# Patient Record
Sex: Female | Born: 1985 | Race: White | Hispanic: No | Marital: Single | Smoking: Current every day smoker
Health system: Southern US, Community
[De-identification: ages and names within clinical notes are randomized; demographics above are authoritative.]

## PROBLEM LIST (undated history)

## (undated) DIAGNOSIS — R87619 Unspecified abnormal cytological findings in specimens from cervix uteri: Secondary | ICD-10-CM

## (undated) DIAGNOSIS — IMO0002 Reserved for concepts with insufficient information to code with codable children: Secondary | ICD-10-CM

## (undated) DIAGNOSIS — F209 Schizophrenia, unspecified: Secondary | ICD-10-CM

## (undated) DIAGNOSIS — R51 Headache: Secondary | ICD-10-CM

## (undated) DIAGNOSIS — F419 Anxiety disorder, unspecified: Secondary | ICD-10-CM

## (undated) DIAGNOSIS — F32A Depression, unspecified: Secondary | ICD-10-CM

## (undated) DIAGNOSIS — A599 Trichomoniasis, unspecified: Secondary | ICD-10-CM

## (undated) DIAGNOSIS — G40909 Epilepsy, unspecified, not intractable, without status epilepticus: Secondary | ICD-10-CM

## (undated) DIAGNOSIS — F329 Major depressive disorder, single episode, unspecified: Secondary | ICD-10-CM

## (undated) DIAGNOSIS — F319 Bipolar disorder, unspecified: Secondary | ICD-10-CM

## (undated) HISTORY — PX: INCISION AND DRAINAGE OF WOUND: SHX1803

## (undated) HISTORY — PX: OTHER SURGICAL HISTORY: SHX169

---

## 2011-01-10 ENCOUNTER — Emergency Department (INDEPENDENT_AMBULATORY_CARE_PROVIDER_SITE_OTHER): Payer: Self-pay

## 2011-01-10 ENCOUNTER — Emergency Department (HOSPITAL_BASED_OUTPATIENT_CLINIC_OR_DEPARTMENT_OTHER)
Admission: EM | Admit: 2011-01-10 | Discharge: 2011-01-10 | Disposition: A | Discharge status: Home / Self Care | Payer: Self-pay | Attending: Emergency Medicine | Admitting: Emergency Medicine

## 2011-01-10 DIAGNOSIS — R0602 Shortness of breath: Secondary | ICD-10-CM

## 2011-01-10 DIAGNOSIS — S90569A Insect bite (nonvenomous), unspecified ankle, initial encounter: Secondary | ICD-10-CM | POA: Insufficient documentation

## 2011-01-10 DIAGNOSIS — J40 Bronchitis, not specified as acute or chronic: Secondary | ICD-10-CM | POA: Insufficient documentation

## 2011-01-10 DIAGNOSIS — F17200 Nicotine dependence, unspecified, uncomplicated: Secondary | ICD-10-CM | POA: Insufficient documentation

## 2011-03-05 ENCOUNTER — Inpatient Hospital Stay (HOSPITAL_COMMUNITY)
Admission: AD | Admit: 2011-03-05 | Discharge: 2011-03-05 | Disposition: A | Discharge status: Home / Self Care | Payer: Medicaid Other | Source: Ambulatory Visit | Attending: Obstetrics & Gynecology | Admitting: Obstetrics & Gynecology

## 2011-03-05 ENCOUNTER — Encounter (HOSPITAL_COMMUNITY): Payer: Self-pay | Admitting: *Deleted

## 2011-03-05 DIAGNOSIS — Z3480 Encounter for supervision of other normal pregnancy, unspecified trimester: Secondary | ICD-10-CM

## 2011-03-05 DIAGNOSIS — J40 Bronchitis, not specified as acute or chronic: Secondary | ICD-10-CM

## 2011-03-05 DIAGNOSIS — O99891 Other specified diseases and conditions complicating pregnancy: Secondary | ICD-10-CM | POA: Insufficient documentation

## 2011-03-05 DIAGNOSIS — J069 Acute upper respiratory infection, unspecified: Secondary | ICD-10-CM | POA: Insufficient documentation

## 2011-03-05 DIAGNOSIS — Z331 Pregnant state, incidental: Secondary | ICD-10-CM

## 2011-03-05 HISTORY — DX: Reserved for concepts with insufficient information to code with codable children: IMO0002

## 2011-03-05 HISTORY — DX: Bipolar disorder, unspecified: F31.9

## 2011-03-05 HISTORY — DX: Schizophrenia, unspecified: F20.9

## 2011-03-05 HISTORY — DX: Anxiety disorder, unspecified: F41.9

## 2011-03-05 HISTORY — DX: Depression, unspecified: F32.A

## 2011-03-05 HISTORY — DX: Unspecified abnormal cytological findings in specimens from cervix uteri: R87.619

## 2011-03-05 HISTORY — DX: Major depressive disorder, single episode, unspecified: F32.9

## 2011-03-05 MED ORDER — ALBUTEROL SULFATE HFA 108 (90 BASE) MCG/ACT IN AERS: 2.0000 | INHALATION_SPRAY | Freq: Once | RESPIRATORY_TRACT | Status: DC

## 2011-03-05 MED ORDER — ALBUTEROL SULFATE HFA 108 (90 BASE) MCG/ACT IN AERS
2.0000 | INHALATION_SPRAY | Freq: Once | RESPIRATORY_TRACT | Status: AC
  Administered 2011-03-05: 2 via RESPIRATORY_TRACT
  Filled 2011-03-05: qty 6.7

## 2011-03-05 NOTE — ED Provider Notes (Addendum)
Informal bedside ultrasound done and pt. Able to visualize cardiac activity.   Agree with note and plan by [name], NP  [facility]

## 2011-03-05 NOTE — ED Provider Notes (Addendum)
History     Chief Complaint  Patient presents with  . URI    The history is provided by the patient.  The patient states that she has had cough and congestion for the past couple weeks. Hx of asthma as a child but no problems for years. Coughing until vomits. Usually smokes a PPD but cut back since pregnant.  She is 9 wks. Gest. And the pregnancy was confirmed in [facility]. She denies bleeding or pain associated with the pregnancy.  She states she does not need a pelvic exam.  OB History    Grav Para Term Preterm Abortions TAB SAB Ect Mult Living   1    1  1          No past medical history on file.  No past surgical history on file.  No family history on file.  History  Substance Use Topics  . Smoking status: Not on file  . Smokeless tobacco: Not on file  . Alcohol Use: Not on file    Allergies: Allergies not on file  No prescriptions prior to admission    Review of Systems  Constitutional: Negative for fever, chills and weight loss.  HENT: Positive for congestion and sore throat. Negative for ear pain and neck pain.   Eyes: Negative for blurred vision and double vision.  Respiratory: Positive for cough and wheezing.   Cardiovascular: Negative.   Gastrointestinal: Positive for nausea. Negative for heartburn and abdominal pain.  Genitourinary: Negative for dysuria, urgency, frequency and flank pain.  Musculoskeletal: Negative for back pain.  Skin: Negative.   Neurological: Negative for dizziness and headaches.  Psychiatric/Behavioral:       Bipolar   Physical Exam   There were no vitals taken for this visit.  Physical Exam  Nursing note and vitals reviewed. Constitutional: She is oriented to person, place, and time. She appears well-developed and well-nourished.  HENT:  Head: Normocephalic and atraumatic.  Eyes: EOM are normal.  Neck: Neck supple.  Cardiovascular: Regular rhythm.   Respiratory: Effort normal. She has wheezes.  GI: Soft. There is no  tenderness.  Musculoskeletal: Normal range of motion.  Neurological: She is alert and oriented to person, place, and time. No cranial nerve deficit.  Skin: Skin is warm and dry.    MAU Course  Procedures Will treat patient with Z-Pak for bronchitis, cough medication and antimetics.  MDM  Agree with note and plan above  Reonna Finlayson A

## 2011-03-05 NOTE — Progress Notes (Signed)
Pt states she has had cold like symptoms for several days. Has not taken any medication not knowing what to take. Wants an ultrasound to make sure the baby is OK

## 2011-03-05 NOTE — Progress Notes (Signed)
Pt reports having a productive cough with green sputum and nasal congestion for 3 days. She states when she coughs her upper chest is tight. She also states she had pink spotting 2 days ago with mild cramping; she deeies spotting today but tender in her lower abd."  states she is tender in her low abd."

## 2011-04-19 ENCOUNTER — Encounter (HOSPITAL_COMMUNITY): Payer: Self-pay | Admitting: *Deleted

## 2011-04-19 ENCOUNTER — Inpatient Hospital Stay (HOSPITAL_COMMUNITY)
Admission: AD | Admit: 2011-04-19 | Discharge: 2011-04-19 | Disposition: A | Discharge status: Home / Self Care | Payer: Medicaid Other | Source: Ambulatory Visit | Attending: Obstetrics & Gynecology | Admitting: Obstetrics & Gynecology

## 2011-04-19 DIAGNOSIS — O99340 Other mental disorders complicating pregnancy, unspecified trimester: Secondary | ICD-10-CM | POA: Insufficient documentation

## 2011-04-19 DIAGNOSIS — J45909 Unspecified asthma, uncomplicated: Secondary | ICD-10-CM | POA: Insufficient documentation

## 2011-04-19 DIAGNOSIS — N949 Unspecified condition associated with female genital organs and menstrual cycle: Secondary | ICD-10-CM | POA: Insufficient documentation

## 2011-04-19 DIAGNOSIS — O9989 Other specified diseases and conditions complicating pregnancy, childbirth and the puerperium: Secondary | ICD-10-CM | POA: Insufficient documentation

## 2011-04-19 DIAGNOSIS — O99891 Other specified diseases and conditions complicating pregnancy: Secondary | ICD-10-CM | POA: Insufficient documentation

## 2011-04-19 DIAGNOSIS — G40909 Epilepsy, unspecified, not intractable, without status epilepticus: Secondary | ICD-10-CM | POA: Insufficient documentation

## 2011-04-19 DIAGNOSIS — F319 Bipolar disorder, unspecified: Secondary | ICD-10-CM | POA: Insufficient documentation

## 2011-04-19 HISTORY — DX: Epilepsy, unspecified, not intractable, without status epilepticus: G40.909

## 2011-04-19 LAB — URINALYSIS, ROUTINE W REFLEX MICROSCOPIC
Bilirubin Urine: NEGATIVE
Ketones, ur: NEGATIVE mg/dL
Leukocytes, UA: NEGATIVE
Nitrite: NEGATIVE
Protein, ur: NEGATIVE mg/dL

## 2011-04-19 LAB — CBC
MCV: 84.7 fL (ref 78.0–100.0)
Platelets: 248 10*3/uL (ref 150–400)
RBC: 4.51 MIL/uL (ref 3.87–5.11)
WBC: 14.9 10*3/uL — ABNORMAL HIGH (ref 4.0–10.5)

## 2011-04-19 LAB — WET PREP, GENITAL

## 2011-04-19 MED ORDER — PROMETHAZINE HCL 25 MG PO TABS
25.0000 mg | ORAL_TABLET | Freq: Once | ORAL | Status: AC
  Administered 2011-04-19: 25 mg via ORAL
  Filled 2011-04-19: qty 1

## 2011-04-19 MED ORDER — PROMETHAZINE HCL 25 MG PO TABS: 25.0000 mg | ORAL_TABLET | Freq: Once | ORAL | Status: AC

## 2011-04-19 NOTE — ED Provider Notes (Signed)
History     CSN: [id number] Arrival date & time: 04/19/2011  8:24 PM  Chief Complaint  Patient presents with  . Abdominal Pain   HPILynda Ray24 y.o.presents with lower abdominal pain X 12 hrs.  Comes and goes but when it comes it is sharp.  G2 P0010.  LMP 01/03/11.  Now [redacted]w[redacted]d.  Denies vaginal bleeding or discharge.   p Past Medical History  Diagnosis Date  . Anxiety   . Abnormal Pap smear   . Depression   . Bipolar disorder schizophrenia  . Bipolar 1 disorder     No meds   . Schizophrenia     no meds   . Asthma   . Epilepsy     Past Surgical History  Procedure Date  . Other surgical history     cyst removed from her" tailbone"  . No past surgeries   . Broken leg     right repair and ankle    Family History  Problem Relation Age of Onset  . Hypertension Mother   . Hyperlipidemia Mother   . Anxiety disorder Mother   . Hypertension Father   . Diabetes Father   . COPD Father   . Diabetes Sister     History  Substance Use Topics  . Smoking status: Current Everyday Smoker -- 0.2 packs/day    Types: Cigarettes  . Smokeless tobacco: Never Used  . Alcohol Use: No    OB History    Grav Para Term Preterm Abortions TAB SAB Ect Mult Living   2    1  1          Review of Systems  Gastrointestinal:       Abdominal pain.  Nausea because she hasn't eaten  Genitourinary: Negative.     Physical Exam  BP 123/83  Pulse 96  Temp(Src) 98.4 F (36.9 C) (Oral)  Resp 20  Ht 5\' 4"  (1.626 m)  Wt 278 lb (126.1 kg)  BMI 47.72 kg/m2  Physical Exam  Constitutional: She is oriented to person, place, and time. She appears well-developed and well-nourished.       obese  Neck: Normal range of motion.  Pulmonary/Chest: Effort normal.  Abdominal: Soft. There is tenderness (on right and left side consistent with round ligament pain.). There is no rebound and no guarding.  Genitourinary: Uterus is enlarged. Uterus is not tender. Cervix exhibits no motion tenderness, no discharge  and no friability. No erythema or bleeding around the vagina. Vaginal discharge: frothy white.       Without bleeding.   Neurological: She is alert and oriented to person, place, and time.  Skin: Skin is warm and dry.   Results for orders placed during the hospital encounter of 04/19/11 (from the past 24 hour(s))  WET PREP, GENITAL     Status: Abnormal   Collection Time   04/19/11  8:50 PM      Component Value Range   Yeast, Wet Prep NONE SEEN  NONE SEEN    Trich, Wet Prep NONE SEEN  NONE SEEN    Clue Cells, Wet Prep NONE SEEN  NONE SEEN    WBC, Wet Prep HPF POC TOO NUMEROUS TO COUNT (*) NONE SEEN   CBC     Status: Abnormal   Collection Time   04/19/11  9:37 PM      Component Value Range   WBC 14.9 (*) 4.0 - 10.5 (K/uL)   RBC 4.51  3.87 - 5.11 (MIL/uL)   Hemoglobin 12.7  12.0 -  15.0 (g/dL)   HCT 16.1  09.6 - 04.5 (%)   MCV 84.7  78.0 - 100.0 (fL)   MCH 28.2  26.0 - 34.0 (pg)   MCHC 33.2  30.0 - 36.0 (g/dL)   RDW 40.9  81.1 - 91.4 (%)   Platelets 248  150 - 400 (K/uL)   Results for orders placed during the hospital encounter of 04/19/11 (from the past 24 hour(s))  WET PREP, GENITAL     Status: Abnormal   Collection Time   04/19/11  8:50 PM      Component Value Range   Yeast, Wet Prep NONE SEEN  NONE SEEN    Trich, Wet Prep NONE SEEN  NONE SEEN    Clue Cells, Wet Prep NONE SEEN  NONE SEEN    WBC, Wet Prep HPF POC TOO NUMEROUS TO COUNT (*) NONE SEEN   CBC     Status: Abnormal   Collection Time   04/19/11  9:37 PM      Component Value Range   WBC 14.9 (*) 4.0 - 10.5 (K/uL)   RBC 4.51  3.87 - 5.11 (MIL/uL)   Hemoglobin 12.7  12.0 - 15.0 (g/dL)   HCT 78.2  95.6 - 21.3 (%)   MCV 84.7  78.0 - 100.0 (fL)   MCH 28.2  26.0 - 34.0 (pg)   MCHC 33.2  30.0 - 36.0 (g/dL)   RDW 08.6  57.8 - 46.9 (%)   Platelets 248  150 - 400 (K/uL)  URINALYSIS, ROUTINE W REFLEX MICROSCOPIC     Status: Abnormal   Collection Time   04/19/11 10:30 PM      Component Value Range   Color, Urine YELLOW   YELLOW    Appearance CLEAR  CLEAR    Specific Gravity, Urine <1.005 (*) 1.005 - 1.030    pH 6.0  5.0 - 8.0    Glucose, UA NEGATIVE  NEGATIVE (mg/dL)   Hgb urine dipstick NEGATIVE  NEGATIVE    Bilirubin Urine NEGATIVE  NEGATIVE    Ketones, ur NEGATIVE  NEGATIVE (mg/dL)   Protein, ur NEGATIVE  NEGATIVE (mg/dL)   Urobilinogen, UA 0.2  0.0 - 1.0 (mg/dL)   Nitrite NEGATIVE  NEGATIVE    Leukocytes, UA NEGATIVE  NEGATIVE    ED Course  Procedures GC/CHlamydia culture to lab Phenergan 25mg  po given in MAU.  She has a ride home.   Assessement: Round ligament pain at [redacted]w[redacted]d gestation.                           Nausea and vomiting in pregnancy Plan;  Keep plans to see Dr. Senaida Ores.  Call for appointment.  You may take tylenol for the round ligament pain Continue the phenergan you have at home for nausea.

## 2011-04-19 NOTE — Progress Notes (Signed)
Pain in lower part of stomach all day today, sharp pain. Denies dysuria, vaginal bleeding or N/V/D.

## 2011-04-19 NOTE — Progress Notes (Signed)
Patient is here with c/o intermittent sharp pain in her lower abdominal. She states that it started this morning. She states that she has nausea *(she didn't take any today) that she uses phenergan as needed. She denies any vaginal bleeding or discharge.

## 2011-04-20 LAB — GC/CHLAMYDIA PROBE AMP, GENITAL: Chlamydia, DNA Probe: NEGATIVE

## 2011-05-14 ENCOUNTER — Inpatient Hospital Stay (HOSPITAL_COMMUNITY)
Admission: AD | Admit: 2011-05-14 | Discharge: 2011-05-14 | Disposition: A | Discharge status: Home / Self Care | Payer: Medicaid Other | Source: Ambulatory Visit | Attending: Obstetrics and Gynecology | Admitting: Obstetrics and Gynecology

## 2011-05-14 ENCOUNTER — Inpatient Hospital Stay (HOSPITAL_COMMUNITY): Payer: Medicaid Other

## 2011-05-14 DIAGNOSIS — O99891 Other specified diseases and conditions complicating pregnancy: Secondary | ICD-10-CM | POA: Insufficient documentation

## 2011-05-14 DIAGNOSIS — S93409A Sprain of unspecified ligament of unspecified ankle, initial encounter: Secondary | ICD-10-CM

## 2011-05-14 DIAGNOSIS — S93401A Sprain of unspecified ligament of right ankle, initial encounter: Secondary | ICD-10-CM

## 2011-05-14 DIAGNOSIS — X58XXXA Exposure to other specified factors, initial encounter: Secondary | ICD-10-CM | POA: Insufficient documentation

## 2011-05-14 LAB — URINALYSIS, ROUTINE W REFLEX MICROSCOPIC
Bilirubin Urine: NEGATIVE
Nitrite: NEGATIVE
Specific Gravity, Urine: 1.01 (ref 1.005–1.030)
Urobilinogen, UA: 0.2 mg/dL (ref 0.0–1.0)
pH: 6 (ref 5.0–8.0)

## 2011-05-14 LAB — URINE MICROSCOPIC-ADD ON

## 2011-05-14 MED ORDER — ACETAMINOPHEN 500 MG PO TABS: 1000.0000 mg | ORAL_TABLET | Freq: Three times a day (TID) | ORAL | Status: DC | PRN

## 2011-05-14 NOTE — Progress Notes (Signed)
Pt states that  Yesterday she twisted her ankle-since then her rt foot and leg are very swollen-she doesn't know if it is related

## 2011-05-14 NOTE — Progress Notes (Signed)
Pt states, " I noticed that my right leg started to swell at 11 am, and it has got worse as the day went on. My leg hurts so bad that I can't put weight on it."

## 2011-05-14 NOTE — ED Provider Notes (Signed)
History     Chief Complaint  Patient presents with  . Leg Swelling   HPI This patient is a 25 year old G2 P0 010 at 18 weeks and 2 days who is seen today in the MAU for her right ankle pain.   the pain started yesterday evening after her suffering and inversion injury at a friend's house. The patient has been in ambulatory on the foot last evening as well as throughout the day today however with great difficulty.  The pain is on the medial aspect of the right foot. The pain is approximately 7/10. There is no radiation of the pain. The patient has taken Tylenol with mild improvement. The patient has a history of a right ankle fracture with repair.  OB History    Grav Para Term Preterm Abortions TAB SAB Ect Mult Living   2    1  1          Past Medical History  Diagnosis Date  . Anxiety   . Abnormal Pap smear   . Depression   . Bipolar disorder schizophrenia  . Bipolar 1 disorder     No meds   . Schizophrenia     no meds   . Asthma   . Epilepsy     Past Surgical History  Procedure Date  . Other surgical history     cyst removed from her" tailbone"  . No past surgeries   . Broken leg     right repair and ankle    Family History  Problem Relation Age of Onset  . Hypertension Mother   . Hyperlipidemia Mother   . Anxiety disorder Mother   . Hypertension Father   . Diabetes Father   . COPD Father   . Diabetes Sister     History  Substance Use Topics  . Smoking status: Current Everyday Smoker -- 0.2 packs/day    Types: Cigarettes  . Smokeless tobacco: Never Used  . Alcohol Use: No    Allergies:  Allergies  Allergen Reactions  . Betadine (Povidone Iodine) Rash  . Contrast Media (Iodinated Diagnostic Agents) Rash  . Darvocet (Propoxyphene N-Acetaminophen) Rash  . Iodine Rash  . Shellfish Allergy Swelling and Rash    Prescriptions prior to admission  Medication Sig Dispense Refill  . acetaminophen (TYLENOL) 325 MG tablet Take 650 mg by mouth daily as needed.         Marland Kitchen albuterol (PROVENTIL HFA;VENTOLIN HFA) 108 (90 BASE) MCG/ACT inhaler Inhale 2 puffs into the lungs once.  1 Inhaler  0  . ALBUTEROL SULFATE HFA IN Inhale 2 puffs into the lungs daily as needed.        . prenatal vitamin w/FE, FA (PRENATAL 1 + 1) 27-1 MG TABS Take 1 tablet by mouth daily.          Review of Systems  All other systems reviewed and are negative.   Physical Exam   Blood pressure 122/75, pulse 91, temperature 99.1 F (37.3 C), temperature source Oral, resp. rate 20, height 5\' 4"  (1.626 m), weight 129.445 kg (285 lb 6 oz).  Physical Exam  Constitutional: She appears well-developed and well-nourished.  HENT:  Head: Normocephalic and atraumatic.  Musculoskeletal: She exhibits edema.       Tenderness to the distal right tibia. There is 2+ edema to the knees bilaterally. There is mild bruising around the medial malleolus. He eversion and inversion are limited in range of motion. There is mildly decreased dorsiflexion and plantar flexion.   Right ankle  x-Stephens - no evidence of acute distal tibia or mid tibia or fibula shaft fracture. MAU Course  Procedures  Assessment and Plan  #1 right ankle sprains - instructed the patient on protection rest ice compression and elevation. Will have patient take Tylenol 1000 mg every 8 hours as needed for pain. Instructed the patient on icing the right ankle for 15 minutes to 4 times a day.  Dannae Kato JEHIEL 05/14/2011, 10:28 PM

## 2011-05-18 LAB — RPR: RPR: NONREACTIVE

## 2011-05-18 LAB — ABO/RH: RH Type: POSITIVE

## 2011-05-18 LAB — ANTIBODY SCREEN: Antibody Screen: NEGATIVE

## 2011-05-18 LAB — HIV ANTIBODY (ROUTINE TESTING W REFLEX): HIV: NONREACTIVE

## 2011-05-22 ENCOUNTER — Other Ambulatory Visit (HOSPITAL_COMMUNITY): Payer: Self-pay | Admitting: Obstetrics and Gynecology

## 2011-05-22 DIAGNOSIS — Z3689 Encounter for other specified antenatal screening: Secondary | ICD-10-CM

## 2011-05-22 DIAGNOSIS — O2690 Pregnancy related conditions, unspecified, unspecified trimester: Secondary | ICD-10-CM

## 2011-06-07 ENCOUNTER — Encounter (HOSPITAL_COMMUNITY): Payer: Self-pay

## 2011-06-07 ENCOUNTER — Ambulatory Visit (HOSPITAL_COMMUNITY)
Admission: RE | Admit: 2011-06-07 | Discharge: 2011-06-07 | Disposition: A | Discharge status: Home / Self Care | Payer: Medicaid Other | Source: Ambulatory Visit | Attending: Obstetrics and Gynecology | Admitting: Obstetrics and Gynecology

## 2011-06-07 DIAGNOSIS — Z3689 Encounter for other specified antenatal screening: Secondary | ICD-10-CM

## 2011-06-07 DIAGNOSIS — O358XX0 Maternal care for other (suspected) fetal abnormality and damage, not applicable or unspecified: Secondary | ICD-10-CM | POA: Insufficient documentation

## 2011-06-07 DIAGNOSIS — Z1389 Encounter for screening for other disorder: Secondary | ICD-10-CM | POA: Insufficient documentation

## 2011-06-07 DIAGNOSIS — E669 Obesity, unspecified: Secondary | ICD-10-CM | POA: Insufficient documentation

## 2011-06-07 DIAGNOSIS — O2690 Pregnancy related conditions, unspecified, unspecified trimester: Secondary | ICD-10-CM

## 2011-06-07 DIAGNOSIS — Z363 Encounter for antenatal screening for malformations: Secondary | ICD-10-CM | POA: Insufficient documentation

## 2011-06-14 ENCOUNTER — Encounter (HOSPITAL_COMMUNITY): Payer: Self-pay

## 2011-06-14 ENCOUNTER — Inpatient Hospital Stay (HOSPITAL_COMMUNITY)
Admission: AD | Admit: 2011-06-14 | Discharge: 2011-06-14 | Disposition: A | Discharge status: Home / Self Care | Payer: Medicaid Other | Source: Ambulatory Visit | Attending: Obstetrics and Gynecology | Admitting: Obstetrics and Gynecology

## 2011-06-14 DIAGNOSIS — J069 Acute upper respiratory infection, unspecified: Secondary | ICD-10-CM | POA: Insufficient documentation

## 2011-06-14 DIAGNOSIS — O99891 Other specified diseases and conditions complicating pregnancy: Secondary | ICD-10-CM | POA: Insufficient documentation

## 2011-06-14 LAB — URINALYSIS, ROUTINE W REFLEX MICROSCOPIC
Bilirubin Urine: NEGATIVE
Ketones, ur: NEGATIVE mg/dL
Nitrite: NEGATIVE
Protein, ur: NEGATIVE mg/dL
Specific Gravity, Urine: 1.01 (ref 1.005–1.030)
Urobilinogen, UA: 0.2 mg/dL (ref 0.0–1.0)

## 2011-06-14 MED ORDER — HYDROCOD POLST-CHLORPHEN POLST 10-8 MG/5ML PO LQCR: 5.0000 mL | Freq: Once | ORAL | Status: DC

## 2011-06-14 MED ORDER — HYDROCOD POLST-CHLORPHEN POLST 10-8 MG/5ML PO LQCR
5.0000 mL | Freq: Once | ORAL | Status: AC
  Administered 2011-06-14: 5 mL via ORAL
  Filled 2011-06-14: qty 5

## 2011-06-14 NOTE — Progress Notes (Signed)
Patient states she started having an itchy throat 2 days ago, started having increasing cold symptoms cough, congestion, runny nose and throat itchy, not painful. Mid to upper back pain from coughing. No fever.

## 2011-06-14 NOTE — Progress Notes (Signed)
Pt states tylenol for pain, Rexall OTC cough syrup, no relief. Has hx asthma as child, however states grew out of it. Has abd pain with coughing. Muscles/ribs feel sore. Denies bleeding or vaginal d/c changes.

## 2011-06-14 NOTE — ED Provider Notes (Signed)
History     Chief Complaint  Patient presents with  . URI   HPI G2 P0010 at [redacted]w[redacted]d presents with c/o cough for 2 days. States has gotten sore in her back and lower abdomen as a result. States has remote history of asthma as a baby, but did use an inhaler this morning. No fever. No other symptoms.   Past Medical History  Diagnosis Date  . Anxiety   . Abnormal Pap smear   . Depression   . Bipolar disorder schizophrenia  . Bipolar 1 disorder     No meds   . Schizophrenia     no meds   . Asthma   . Epilepsy     Past Surgical History  Procedure Date  . Other surgical history     cyst removed from her" tailbone"  . Broken leg     right repair and ankle  . Incision and drainage of wound     pyelonidal cyst    Family History  Problem Relation Age of Onset  . Hypertension Mother   . Hyperlipidemia Mother   . Anxiety disorder Mother   . Hypertension Father   . Diabetes Father   . COPD Father   . Diabetes Sister     History  Substance Use Topics  . Smoking status: Current Everyday Smoker -- 0.2 packs/day    Types: Cigarettes  . Smokeless tobacco: Never Used  . Alcohol Use: No    Allergies:  Allergies  Allergen Reactions  . Betadine (Povidone Iodine) Rash  . Contrast Media (Iodinated Diagnostic Agents) Rash  . Darvocet (Propoxyphene N-Acetaminophen) Rash  . Iodine Rash  . Shellfish Allergy Swelling and Rash    Prescriptions prior to admission  Medication Sig Dispense Refill  . acetaminophen (TYLENOL) 500 MG tablet Take 2 tablets (1,000 mg total) by mouth every 8 (eight) hours as needed for pain. Headache/pain  30 tablet    . albuterol (PROVENTIL HFA;VENTOLIN HFA) 108 (90 BASE) MCG/ACT inhaler Inhale 2 puffs into the lungs as needed. Shortness of breath      . prenatal vitamin w/FE, FA (PRENATAL 1 + 1) 27-1 MG TABS Take 1 tablet by mouth daily.        . ALBUTEROL SULFATE HFA IN Inhale 2 puffs into the lungs daily as needed.          Review of Systems    Constitutional: Positive for chills. Negative for fever.  HENT: Positive for congestion. Negative for sore throat.   Respiratory: Positive for cough and wheezing. Negative for sputum production.    Physical Exam   Blood pressure 135/74, pulse 110, temperature 97.4 F (36.3 C), temperature source Oral, resp. rate 24, height 5' 5.5" (1.664 m), weight 281 lb (127.461 kg), last menstrual period 01/03/2011, SpO2 96.00%.  Physical Exam  Constitutional: She is oriented to person, place, and time. She appears well-developed and well-nourished. No distress.  HENT:  Head: Normocephalic.  Neck: Normal range of motion.  Cardiovascular:       Heart rate 120s s/p inhaler  Respiratory: Effort normal. No respiratory distress. She has wheezes (Insp wheeze left upper, exp. wheeze right upper). She has no rales.  Neurological: She is alert and oriented to person, place, and time.  Skin: Skin is warm and dry.  Psychiatric: She has a normal mood and affect.    MAU Course  Procedures  MDM  Assessment and Plan  A:  URI      IUP at 23.1 weeks  P:  Consulted Dr Jackelyn Knife      Will give Tussionex as adjuvant antitussive      Smoking cessation counseling      Follow up with office if worsens      Use inhaler q 6 hrs prn  [facility] 06/14/2011, 10:36 AM

## 2011-06-24 ENCOUNTER — Inpatient Hospital Stay (HOSPITAL_COMMUNITY)
Admission: AD | Admit: 2011-06-24 | Discharge: 2011-06-24 | Disposition: A | Discharge status: Home / Self Care | Payer: Medicaid Other | Source: Ambulatory Visit | Attending: Obstetrics and Gynecology | Admitting: Obstetrics and Gynecology

## 2011-06-24 ENCOUNTER — Encounter (HOSPITAL_COMMUNITY): Payer: Self-pay | Admitting: Advanced Practice Midwife

## 2011-06-24 ENCOUNTER — Encounter (HOSPITAL_COMMUNITY): Payer: Self-pay | Admitting: Obstetrics and Gynecology

## 2011-06-24 DIAGNOSIS — O2692 Pregnancy related conditions, unspecified, second trimester: Secondary | ICD-10-CM

## 2011-06-24 DIAGNOSIS — O99891 Other specified diseases and conditions complicating pregnancy: Secondary | ICD-10-CM | POA: Insufficient documentation

## 2011-06-24 DIAGNOSIS — O2690 Pregnancy related conditions, unspecified, unspecified trimester: Secondary | ICD-10-CM

## 2011-06-24 LAB — POCT FERN TEST: Fern Test: NEGATIVE

## 2011-06-24 LAB — URINALYSIS, ROUTINE W REFLEX MICROSCOPIC
Glucose, UA: NEGATIVE mg/dL
Hgb urine dipstick: NEGATIVE
Leukocytes, UA: NEGATIVE
Protein, ur: NEGATIVE mg/dL
Specific Gravity, Urine: >1.03 — ABNORMAL HIGH (ref 1.005–1.030)
pH: 5.5 (ref 5.0–8.0)

## 2011-06-24 NOTE — ED Provider Notes (Signed)
History     CSN: [id number] Arrival date & time: 06/24/2011  2:55 PM   None     Chief Complaint  Patient presents with  . Vaginal Discharge    HPI Casey Price is a 25 y.o. female @ [redacted]w[redacted]d who presents to MAU for ? Rupture of membranes. She states that last night she coughed and felt a "pop" in her abdomen and then felt fluid leaking and then twice today. Denies bleeding or any other problems. She is very concerned because another family member had a preterm delivery and the baby is in NICU.   Past Medical History  Diagnosis Date  . Anxiety   . Abnormal Pap smear   . Depression   . Bipolar disorder schizophrenia  . Bipolar 1 disorder     No meds   . Schizophrenia     no meds   . Asthma   . Epilepsy     Past Surgical History  Procedure Date  . Other surgical history     cyst removed from her" tailbone"  . Broken leg     right repair and ankle  . Incision and drainage of wound     pyelonidal cyst    Family History  Problem Relation Age of Onset  . Hypertension Mother   . Hyperlipidemia Mother   . Anxiety disorder Mother   . Hypertension Father   . Diabetes Father   . COPD Father   . Diabetes Sister     History  Substance Use Topics  . Smoking status: Current Everyday Smoker -- 0.2 packs/day    Types: Cigarettes  . Smokeless tobacco: Never Used  . Alcohol Use: No    OB History    Grav Para Term Preterm Abortions TAB SAB Ect Mult Living   2    1  1          Review of Systems  Allergies  Betadine; Contrast media; Darvocet; Iodine; and Shellfish allergy  Home Medications  No current outpatient prescriptions on file.  BP 129/65  Pulse 116  Temp(Src) 98.7 F (37.1 C) (Oral)  Ht 5\' 4"  (1.626 m)  Wt 277 lb 3.2 oz (125.737 kg)  BMI 47.58 kg/m2  LMP 01/03/2011  Physical Exam  Nursing note and vitals reviewed. Constitutional: She is oriented to person, place, and time. No distress.       Obese  HENT:  Head: Normocephalic.       hirsutism    Eyes: EOM are normal.  Neck: Neck supple.  Cardiovascular:       tachycardia  Pulmonary/Chest: Effort normal.  Abdominal: Soft.       Gravid at 24.1 wks. gestation  Genitourinary:       External genitalia without lesions. Mucous discharge vaginal vault, no pooling noted. Cervix closed and thick.   Musculoskeletal: Normal range of motion.  Neurological: She is alert and oriented to person, place, and time. No cranial nerve deficit.  Skin: Skin is warm and dry.  Psychiatric: Thought content normal.       bipolar   EFM tracing shows no contractions, base line fetal heart rate 150.  ED Course: consult with Dr. Jackelyn Knife. Will d/c patient home to follow up in the office.  Procedures   Assessment: Second trimester pregnancy   No rupture of membranes  Plan:  Discharge home to follow up in the office   Return here as needed.        [place], [place] 06/24/11 [phone]

## 2011-06-24 NOTE — Progress Notes (Signed)
Pt presents to MAU with chief complaint of possible ROM. Pt is 24 weeks 4 days and noticed fluid leaking last night around 9PM. She noticed the trickling of fluid throughout the day.

## 2011-06-24 NOTE — Progress Notes (Signed)
Yesterday coughed and felt something pop in her stomach felt a little gush, today felt gushes x 2.

## 2011-07-23 ENCOUNTER — Encounter (HOSPITAL_COMMUNITY): Payer: Self-pay | Admitting: *Deleted

## 2011-07-23 ENCOUNTER — Inpatient Hospital Stay (HOSPITAL_COMMUNITY)
Admission: AD | Admit: 2011-07-23 | Discharge: 2011-07-23 | Disposition: A | Discharge status: Home / Self Care | Payer: Medicaid Other | Source: Ambulatory Visit | Attending: Obstetrics and Gynecology | Admitting: Obstetrics and Gynecology

## 2011-07-23 DIAGNOSIS — N949 Unspecified condition associated with female genital organs and menstrual cycle: Secondary | ICD-10-CM

## 2011-07-23 DIAGNOSIS — R109 Unspecified abdominal pain: Secondary | ICD-10-CM | POA: Insufficient documentation

## 2011-07-23 DIAGNOSIS — O99891 Other specified diseases and conditions complicating pregnancy: Secondary | ICD-10-CM | POA: Insufficient documentation

## 2011-07-23 LAB — WET PREP, GENITAL: Clue Cells Wet Prep HPF POC: NONE SEEN

## 2011-07-23 LAB — URINALYSIS, ROUTINE W REFLEX MICROSCOPIC
Glucose, UA: NEGATIVE mg/dL
Ketones, ur: NEGATIVE mg/dL
Nitrite: NEGATIVE
Specific Gravity, Urine: >1.03 — ABNORMAL HIGH (ref 1.005–1.030)
pH: 5.5 (ref 5.0–8.0)

## 2011-07-23 LAB — URINE MICROSCOPIC-ADD ON

## 2011-07-23 MED ORDER — COMFORT FIT MATERNITY SUPP LG MISC: 1.0000 [IU] | Status: DC | PRN

## 2011-07-23 NOTE — ED Provider Notes (Signed)
History     Chief Complaint  Patient presents with  . Abdominal Pain   HPI Sharp low abd pain today starting around 9 AM, has happened about 5 or 6 times in the last 5 hours, radiates to back, lasts about 3-10 seconds, worse with moving around. + fetal movement, no bleeding or LOF.     Past Medical History  Diagnosis Date  . Anxiety   . Abnormal Pap smear   . Depression   . Bipolar disorder schizophrenia  . Bipolar 1 disorder     No meds   . Schizophrenia     no meds   . Asthma   . Epilepsy     Past Surgical History  Procedure Date  . Other surgical history     cyst removed from her" tailbone"  . Broken leg     right repair and ankle  . Incision and drainage of wound     pyelonidal cyst    Family History  Problem Relation Age of Onset  . Hypertension Mother   . Hyperlipidemia Mother   . Anxiety disorder Mother   . Hypertension Father   . Diabetes Father   . COPD Father   . Diabetes Sister     History  Substance Use Topics  . Smoking status: Current Everyday Smoker -- 0.2 packs/day    Types: Cigarettes  . Smokeless tobacco: Never Used  . Alcohol Use: No    Allergies:  Allergies  Allergen Reactions  . Betadine (Povidone Iodine) Rash  . Contrast Media (Iodinated Diagnostic Agents) Rash  . Darvocet (Propoxyphene N-Acetaminophen) Rash  . Iodine Rash  . Shellfish Allergy Swelling and Rash    No prescriptions prior to admission    Review of Systems  Constitutional: Negative.   Respiratory: Negative.   Cardiovascular: Negative.   Gastrointestinal: Positive for abdominal pain. Negative for nausea, vomiting, diarrhea and constipation.  Genitourinary: Negative for dysuria, urgency, frequency, hematuria and flank pain.       Negative for vaginal bleeding, cramping/contractions  Musculoskeletal: Negative.   Neurological: Negative.   Psychiatric/Behavioral: Negative.    Physical Exam   Blood pressure 139/71, pulse 110, temperature 97.9 F (36.6 C),  temperature source Oral, resp. rate 18, height 5\' 5"  (1.651 m), weight 129.275 kg (285 lb), last menstrual period 01/03/2011.  Physical Exam  Nursing note and vitals reviewed. Constitutional: She is oriented to person, place, and time. She appears well-developed and well-nourished. No distress.  Cardiovascular: Normal rate.   Respiratory: Effort normal.  GI: Soft. There is no tenderness.  Genitourinary: No bleeding around the vagina. Vaginal discharge (thick, white) found.       SVE: closed/thick/high  Musculoskeletal: Normal range of motion.  Neurological: She is alert and oriented to person, place, and time.  Skin: Skin is warm and dry.  Psychiatric: She has a normal mood and affect.   EFM reactive, TOCO quiet MAU Course  Procedures  Results for orders placed during the hospital encounter of 07/23/11 (from the past 24 hour(s))  URINALYSIS, ROUTINE W REFLEX MICROSCOPIC     Status: Abnormal   Collection Time   07/23/11  2:00 PM      Component Value Range   Color, Urine YELLOW  YELLOW    APPearance HAZY (*) CLEAR    Specific Gravity, Urine >1.030 (*) 1.005 - 1.030    pH 5.5  5.0 - 8.0    Glucose, UA NEGATIVE  NEGATIVE (mg/dL)   Hgb urine dipstick NEGATIVE  NEGATIVE  Bilirubin Urine NEGATIVE  NEGATIVE    Ketones, ur NEGATIVE  NEGATIVE (mg/dL)   Protein, ur NEGATIVE  NEGATIVE (mg/dL)   Urobilinogen, UA 0.2  0.0 - 1.0 (mg/dL)   Nitrite NEGATIVE  NEGATIVE    Leukocytes, UA TRACE (*) NEGATIVE   URINE MICROSCOPIC-ADD ON     Status: Abnormal   Collection Time   07/23/11  2:00 PM      Component Value Range   Squamous Epithelial / LPF MANY (*) RARE    WBC, UA 0-2  <3 (WBC/hpf)   Bacteria, UA FEW (*) RARE    Crystals CA OXALATE CRYSTALS (*) NEGATIVE   WET PREP, GENITAL     Status: Abnormal   Collection Time   07/23/11  2:25 PM      Component Value Range   Yeast, Wet Prep NONE SEEN  NONE SEEN    Trich, Wet Prep NONE SEEN  NONE SEEN    Clue Cells, Wet Prep NONE SEEN  NONE SEEN     WBC, Wet Prep HPF POC FEW (*) NONE SEEN      Assessment and Plan  25 y.o. G2P0010 at [redacted]w[redacted]d Round ligament pain, no signs of labor F/U as scheduled, precautions rev'd  FRAZIER,NATALIE 07/23/2011, 7:30 PM

## 2011-07-23 NOTE — Progress Notes (Signed)
Pt in for lower abdominal sharp pains that radiate towards back.  States pains started this morning and are worse with movement.  Denies any urinary symptoms.  + FM.  Denies any bleeding or leaking of fluid.

## 2011-07-24 LAB — GC/CHLAMYDIA PROBE AMP, GENITAL: GC Probe Amp, Genital: NEGATIVE

## 2011-08-21 NOTE — L&D Delivery Note (Signed)
Delivery Note At 6:30 PM a healthy female was delivered via Vaginal, Spontaneous Delivery (Presentation:LOA).  APGAR: 8,9  weight 6 lb 8.2 oz (2954 g).   Placenta status delivered spontaneously.   Cord:  with the following complications: none.   Anesthesia: Epidural  Episiotomy: n/a  Lacerations: First degree and right labial Suture Repair: 3.0 vicryl rapide Est. Blood Loss (mL): 300cc Mom to postpartum.  Baby to nursery-stable.  Oliver Pila 09/20/2011, 6:43 PM

## 2011-09-03 ENCOUNTER — Inpatient Hospital Stay (HOSPITAL_COMMUNITY)
Admission: AD | Admit: 2011-09-03 | Discharge: 2011-09-03 | Disposition: A | Discharge status: Home / Self Care | Payer: Medicaid Other | Source: Ambulatory Visit | Attending: Obstetrics and Gynecology | Admitting: Obstetrics and Gynecology

## 2011-09-03 ENCOUNTER — Encounter (HOSPITAL_COMMUNITY): Payer: Self-pay | Admitting: *Deleted

## 2011-09-03 DIAGNOSIS — O4700 False labor before 37 completed weeks of gestation, unspecified trimester: Secondary | ICD-10-CM | POA: Insufficient documentation

## 2011-09-03 DIAGNOSIS — R030 Elevated blood-pressure reading, without diagnosis of hypertension: Secondary | ICD-10-CM

## 2011-09-03 DIAGNOSIS — R109 Unspecified abdominal pain: Secondary | ICD-10-CM | POA: Insufficient documentation

## 2011-09-03 HISTORY — DX: Headache: R51

## 2011-09-03 HISTORY — DX: Trichomoniasis, unspecified: A59.9

## 2011-09-03 LAB — URINALYSIS, ROUTINE W REFLEX MICROSCOPIC
Glucose, UA: NEGATIVE mg/dL
Hgb urine dipstick: NEGATIVE
Ketones, ur: NEGATIVE mg/dL
Leukocytes, UA: NEGATIVE
pH: 6.5 (ref 5.0–8.0)

## 2011-09-03 MED ORDER — TERBUTALINE SULFATE 1 MG/ML IJ SOLN
0.2500 mg | Freq: Once | INTRAMUSCULAR | Status: AC
  Administered 2011-09-03: 11:00:00 via SUBCUTANEOUS
  Filled 2011-09-03: qty 1

## 2011-09-03 NOTE — ED Notes (Signed)
C/o burning and pressure with urination

## 2011-09-03 NOTE — ED Notes (Signed)
Pt states has not felt any "balling up" since been placed on monitor.

## 2011-09-03 NOTE — ED Notes (Addendum)
Dr Ambrose Mantle notified of cervical change, prior ext cervix loose 1, internal os- ft 90. Now ext 4/80.

## 2011-09-03 NOTE — ED Notes (Signed)
Bedside US confirmed vtx, will notify MD

## 2011-09-03 NOTE — ED Provider Notes (Signed)
History     CSN: [id number]  Arrival date & time 09/03/11  0855   None     Chief Complaint  Patient presents with  . Labor Eval    HPI Casey Price is a 26 y.o. female @ [redacted]w[redacted]d gestation who presents to MAU for abdominal cramping. She describes the pain as balling up, feeling tight. Onset of back pain last pm. She had one episode of the cramping prior to being placed on the monitor but none since then. She denies bleeding or vaginal discharge. Patient states that she is feeling the baby move a lot. The history was provided by the patient.  Past Medical History  Diagnosis Date  . Anxiety   . Abnormal Pap smear   . Depression   . Bipolar disorder schizophrenia  . Bipolar 1 disorder     No meds   . Schizophrenia     no meds   . Asthma   . Epilepsy     "grew out of it, no problem since age 35"  . Headache   . Trichomonas     Past Surgical History  Procedure Date  . Other surgical history     cyst removed from her" tailbone"  . Broken leg     right repair and ankle- no surgical repair  . Incision and drainage of wound     pyelonidal cyst    Family History  Problem Relation Age of Onset  . Hypertension Mother   . Hyperlipidemia Mother   . Anxiety disorder Mother   . Hypertension Father   . Diabetes Father   . COPD Father   . Diabetes Sister   . Anesthesia problems Other     History  Substance Use Topics  . Smoking status: Current Everyday Smoker -- 0.2 packs/day for 13 years    Types: Cigarettes  . Smokeless tobacco: Never Used  . Alcohol Use: No     prior to preg    OB History    Grav Para Term Preterm Abortions TAB SAB Ect Mult Living   2    1  1          Review of Systems  Constitutional: Negative for fever, chills, diaphoresis and fatigue.  HENT: Negative for ear pain, congestion, sore throat, facial swelling, neck pain, neck stiffness, dental problem and sinus pressure.   Eyes: Negative for photophobia, pain and discharge.  Respiratory:  Negative for cough, chest tightness and wheezing.   Cardiovascular: Negative.   Gastrointestinal: Positive for nausea, abdominal pain and constipation. Negative for vomiting, diarrhea and abdominal distention.  Genitourinary: Positive for urgency, frequency and vaginal discharge. Negative for dysuria, flank pain, vaginal bleeding and difficulty urinating.  Musculoskeletal: Positive for back pain. Negative for myalgias and gait problem.  Skin: Negative for color change and rash.  Neurological: Negative for dizziness, speech difficulty, weakness, light-headedness, numbness and headaches.  Psychiatric/Behavioral: Negative for confusion and agitation. Nervous/anxious: bipolar.     Allergies  Betadine; Contrast media; Darvocet; Iodine; and Shellfish allergy  Home Medications  No current outpatient prescriptions on file.  BP 120/72  Pulse 106  Temp(Src) 97.9 F (36.6 C) (Oral)  Resp 22  LMP 01/03/2011  Physical Exam  Nursing note and vitals reviewed. Constitutional: She is oriented to person, place, and time. She appears well-developed and well-nourished.  HENT:  Head: Normocephalic.  Eyes: EOM are normal.  Neck: Neck supple.  Cardiovascular:       Tachycardia   Pulmonary/Chest: Effort normal.  Abdominal: Soft.  There is minimal tenderness on palpation of left upper abdomen.   Musculoskeletal: Normal range of motion.  Neurological: She is alert and oriented to person, place, and time. No cranial nerve deficit.  Skin: Skin is warm and dry.  Psychiatric: Her behavior is normal. Judgment and thought content normal.   Results for orders placed during the hospital encounter of 09/03/11 (from the past 24 hour(s))  URINALYSIS, ROUTINE W REFLEX MICROSCOPIC     Status: Abnormal   Collection Time   09/03/11  9:48 AM      Component Value Range   Color, Urine YELLOW  YELLOW    APPearance CLEAR  CLEAR    Specific Gravity, Urine <1.005 (*) 1.005 - 1.030    pH 6.5  5.0 - 8.0     Glucose, UA NEGATIVE  NEGATIVE (mg/dL)   Hgb urine dipstick NEGATIVE  NEGATIVE    Bilirubin Urine NEGATIVE  NEGATIVE    Ketones, ur NEGATIVE  NEGATIVE (mg/dL)   Protein, ur NEGATIVE  NEGATIVE (mg/dL)   Urobilinogen, UA 0.2  0.0 - 1.0 (mg/dL)   Nitrite NEGATIVE  NEGATIVE    Leukocytes, UA NEGATIVE  NEGATIVE    Monitor tracing is reactive with rare contraction.  ED Course: discussed with Dr. Ambrose Mantle, will continue to monitor and  RN to check cervix and report back to Dr. Ambrose Mantle.  Procedures RN checked patient's cervix and was fingertip and thinning. She spoke with Dr. Ambrose Mantle and he ordered Terb. And continued monitoring.  Re evaluation: Contractions increased and recheck of cervix patient is dilated to 4 cm. Dr. Ambrose Mantle notified and he will admit patient.  MDM          [name], NP 09/03/11 1220

## 2011-09-03 NOTE — ED Notes (Signed)
Pt crying out occ " ow"  abd remains soft to palpation.  Pt breathing as if in labor, no palpable changes noted in uterine tone.

## 2011-09-03 NOTE — ED Notes (Signed)
Called into rm, "pain at cervix"  Pt up to br, then checked.

## 2011-09-03 NOTE — Progress Notes (Signed)
Back Pain started last night. abd pain started this morning- balling up, stops her in her tracks.

## 2011-09-12 ENCOUNTER — Other Ambulatory Visit: Payer: Self-pay | Admitting: Obstetrics and Gynecology

## 2011-09-12 ENCOUNTER — Inpatient Hospital Stay (HOSPITAL_COMMUNITY)
Admission: AD | Admit: 2011-09-12 | Discharge: 2011-09-12 | Disposition: A | Discharge status: Home / Self Care | Payer: Medicaid Other | Source: Ambulatory Visit | Attending: Obstetrics and Gynecology | Admitting: Obstetrics and Gynecology

## 2011-09-12 ENCOUNTER — Encounter (HOSPITAL_COMMUNITY): Payer: Self-pay

## 2011-09-12 DIAGNOSIS — O99891 Other specified diseases and conditions complicating pregnancy: Secondary | ICD-10-CM | POA: Insufficient documentation

## 2011-09-12 DIAGNOSIS — R030 Elevated blood-pressure reading, without diagnosis of hypertension: Secondary | ICD-10-CM | POA: Insufficient documentation

## 2011-09-12 LAB — COMPREHENSIVE METABOLIC PANEL
Alkaline Phosphatase: 163 U/L — ABNORMAL HIGH (ref 39–117)
BUN: 5 mg/dL — ABNORMAL LOW (ref 6–23)
Chloride: 105 mEq/L (ref 96–112)
Creatinine, Ser: 0.54 mg/dL (ref 0.50–1.10)
GFR calc Af Amer: >90 mL/min (ref >90)
Glucose, Bld: 84 mg/dL (ref 70–99)
Potassium: 3.7 mEq/L (ref 3.5–5.1)
Total Bilirubin: 0.2 mg/dL — ABNORMAL LOW (ref 0.3–1.2)

## 2011-09-12 LAB — CBC
HCT: 39.6 % (ref 36.0–46.0)
Hemoglobin: 13 g/dL (ref 12.0–15.0)
MCHC: 32.8 g/dL (ref 30.0–36.0)
MCV: 85 fL (ref 78.0–100.0)
WBC: 14.9 10*3/uL — ABNORMAL HIGH (ref 4.0–10.5)

## 2011-09-12 LAB — URINALYSIS, ROUTINE W REFLEX MICROSCOPIC
Ketones, ur: NEGATIVE mg/dL
Leukocytes, UA: NEGATIVE
Nitrite: NEGATIVE
Protein, ur: NEGATIVE mg/dL
Urobilinogen, UA: 0.2 mg/dL (ref 0.0–1.0)

## 2011-09-12 NOTE — Progress Notes (Signed)
Patient sent from the office for evaluation of elevated blood pressure and proteinuria. Patient states she had a headache yesterday but not today. Denies any bleeding or pain and reports good fetal movement.

## 2011-09-12 NOTE — ED Provider Notes (Signed)
History   Pt presents today for preeclamptic workup. She is currently 36.0wks and was seen today in the office with elevated BP, proteinuria, and hx of HA. She denies HA today, RUQ pain, or blurry vision. She reports GFM.   Chief Complaint  Patient presents with  . Proteinuria  . Hypertension   HPI  OB History    Grav Para Term Preterm Abortions TAB SAB Ect Mult Living   2    1  1          Past Medical History  Diagnosis Date  . Anxiety   . Abnormal Pap smear   . Depression   . Bipolar disorder schizophrenia  . Bipolar 1 disorder     No meds   . Schizophrenia     no meds   . Asthma   . Epilepsy     "grew out of it, no problem since age 59"  . Headache   . Trichomonas     Past Surgical History  Procedure Date  . Other surgical history     cyst removed from her" tailbone"  . Broken leg     right repair and ankle- no surgical repair  . Incision and drainage of wound     pyelonidal cyst    Family History  Problem Relation Age of Onset  . Hypertension Mother   . Hyperlipidemia Mother   . Anxiety disorder Mother   . Hypertension Father   . Diabetes Father   . COPD Father   . Diabetes Sister   . Anesthesia problems Other     History  Substance Use Topics  . Smoking status: Current Everyday Smoker -- 0.2 packs/day for 13 years    Types: Cigarettes  . Smokeless tobacco: Never Used  . Alcohol Use: No     prior to preg    Allergies:  Allergies  Allergen Reactions  . Betadine (Povidone Iodine) Rash  . Contrast Media (Iodinated Diagnostic Agents) Rash  . Darvocet (Propoxyphene N-Acetaminophen) Rash  . Iodine Rash  . Shellfish Allergy Swelling and Rash    Prescriptions prior to admission  Medication Sig Dispense Refill  . albuterol (PROVENTIL HFA;VENTOLIN HFA) 108 (90 BASE) MCG/ACT inhaler Inhale 2 puffs into the lungs every 4 (four) hours as needed. Shortness of breath      . Elastic Bandages & Supports (COMFORT FIT MATERNITY SUPP LG) MISC 1 Units by Does  not apply route as needed (pain).  1 each  0    Review of Systems  Constitutional: Negative for fever and chills.  Eyes: Negative for blurred vision and double vision.  Cardiovascular: Negative for chest pain and palpitations.  Gastrointestinal: Negative for nausea, vomiting, abdominal pain, diarrhea and constipation.  Genitourinary: Negative for dysuria, urgency, frequency and hematuria.  Neurological: Negative for dizziness and headaches.  Psychiatric/Behavioral: Negative for depression and suicidal ideas.   Physical Exam   Blood pressure 124/78, pulse 105, temperature 98.2 F (36.8 C), temperature source Oral, resp. rate 20, last menstrual period 01/03/2011, SpO2 96.00%.  Physical Exam  Nursing note and vitals reviewed. Constitutional: She is oriented to person, place, and time. She appears well-developed and well-nourished. No distress.  HENT:  Head: Normocephalic and atraumatic.  Eyes: EOM are normal. Pupils are equal, round, and reactive to light.  Neck: Normal range of motion. Neck supple.  GI: Soft. She exhibits no distension. There is no tenderness. There is no rebound and no guarding.  Neurological: She is alert and oriented to person, place, and time.  Skin: Skin is warm and dry. She is not diaphoretic.  Psychiatric: She has a normal mood and affect. Her behavior is normal. Judgment and thought content normal.    MAU Course  Procedures  Results for orders placed during the hospital encounter of 09/12/11 (from the past 72 hour(s))  CBC     Status: Abnormal   Collection Time   09/12/11 10:35 AM      Component Value Range Comment   WBC 14.9 (*) 4.0 - 10.5 (K/uL)    RBC 4.66  3.87 - 5.11 (MIL/uL)    Hemoglobin 13.0  12.0 - 15.0 (g/dL)    HCT 45.4  09.8 - 11.9 (%)    MCV 85.0  78.0 - 100.0 (fL)    MCH 27.9  26.0 - 34.0 (pg)    MCHC 32.8  30.0 - 36.0 (g/dL)    RDW 14.7  82.9 - 56.2 (%)    Platelets 278  150 - 400 (K/uL)   COMPREHENSIVE METABOLIC PANEL     Status:  Abnormal   Collection Time   09/12/11 10:35 AM      Component Value Range Comment   Sodium 136  135 - 145 (mEq/L)    Potassium 3.7  3.5 - 5.1 (mEq/L)    Chloride 105  96 - 112 (mEq/L)    CO2 23  19 - 32 (mEq/L)    Glucose, Bld 84  70 - 99 (mg/dL)    BUN 5 (*) 6 - 23 (mg/dL)    Creatinine, Ser 1.30  0.50 - 1.10 (mg/dL)    Calcium 86.5  8.4 - 10.5 (mg/dL)    Total Protein 6.3  6.0 - 8.3 (g/dL)    Albumin 2.7 (*) 3.5 - 5.2 (g/dL)    AST 9  0 - 37 (U/L)    ALT 6  0 - 35 (U/L)    Alkaline Phosphatase 163 (*) 39 - 117 (U/L)    Total Bilirubin 0.2 (*) 0.3 - 1.2 (mg/dL)    GFR calc non Af Amer >90  >90 (mL/min)    GFR calc Af Amer >90  >90 (mL/min)   URINALYSIS, ROUTINE W REFLEX MICROSCOPIC     Status: Normal   Collection Time   09/12/11 10:48 AM      Component Value Range Comment   Color, Urine YELLOW  YELLOW     APPearance CLEAR  CLEAR     Specific Gravity, Urine 1.010  1.005 - 1.030     pH 8.0  5.0 - 8.0     Glucose, UA NEGATIVE  NEGATIVE (mg/dL)    Hgb urine dipstick NEGATIVE  NEGATIVE     Bilirubin Urine NEGATIVE  NEGATIVE     Ketones, ur NEGATIVE  NEGATIVE (mg/dL)    Protein, ur NEGATIVE  NEGATIVE (mg/dL)    Urobilinogen, UA 0.2  0.0 - 1.0 (mg/dL)    Nitrite NEGATIVE  NEGATIVE     Leukocytes, UA NEGATIVE  NEGATIVE  MICROSCOPIC NOT DONE ON URINES WITH NEGATIVE PROTEIN, BLOOD, LEUKOCYTES, NITRITE, OR GLUCOSE <1000 mg/dL.   NST reactive.  Discussed pt with Dr. Ellyn Hack. Ok to dc to home with 24hr urine for protein and CrCl.  Assessment and Plan  Preg: discussed with pt at length. Discussed signs and sx of preeclampsia. Discussed diet, activity, risks, and precautions. Reminded of [facility].  Clinton Gallant. Rice III, DrHSc, MPAS, PA-C  09/12/2011, 10:30 AM   Henrietta Hoover, PA 09/12/11 1139

## 2011-09-20 ENCOUNTER — Encounter (HOSPITAL_COMMUNITY): Payer: Self-pay | Admitting: *Deleted

## 2011-09-20 ENCOUNTER — Inpatient Hospital Stay (HOSPITAL_COMMUNITY): Payer: Medicaid Other | Admitting: Anesthesiology

## 2011-09-20 ENCOUNTER — Inpatient Hospital Stay (HOSPITAL_COMMUNITY)
Admission: AD | Admit: 2011-09-20 | Discharge: 2011-09-22 | DRG: 775 | Disposition: A | Discharge status: Home / Self Care | Payer: Medicaid Other | Source: Ambulatory Visit | Attending: Obstetrics and Gynecology | Admitting: Obstetrics and Gynecology

## 2011-09-20 ENCOUNTER — Encounter (HOSPITAL_COMMUNITY): Payer: Self-pay | Admitting: Anesthesiology

## 2011-09-20 ENCOUNTER — Other Ambulatory Visit: Payer: Self-pay | Admitting: Obstetrics and Gynecology

## 2011-09-20 DIAGNOSIS — Z22330 Carrier of Group B streptococcus: Secondary | ICD-10-CM

## 2011-09-20 DIAGNOSIS — O99892 Other specified diseases and conditions complicating childbirth: Secondary | ICD-10-CM | POA: Diagnosis present

## 2011-09-20 LAB — CBC
HCT: 39 % (ref 36.0–46.0)
Hemoglobin: 12.9 g/dL (ref 12.0–15.0)
MCHC: 33.1 g/dL (ref 30.0–36.0)
MCV: 84.8 fL (ref 78.0–100.0)

## 2011-09-20 LAB — STREP B DNA PROBE: GBS: POSITIVE

## 2011-09-20 LAB — ABO/RH: ABO/RH(D): O POS

## 2011-09-20 MED ORDER — PHENYLEPHRINE 40 MCG/ML (10ML) SYRINGE FOR IV PUSH (FOR BLOOD PRESSURE SUPPORT)
80.0000 ug | PREFILLED_SYRINGE | INTRAVENOUS | Status: DC | PRN
  Filled 2011-09-20: qty 5

## 2011-09-20 MED ORDER — OXYTOCIN 20 UNITS IN LACTATED RINGERS INFUSION - SIMPLE
125.0000 mL/h | Freq: Once | INTRAVENOUS | Status: AC
  Administered 2011-09-20: 999 mL/h via INTRAVENOUS

## 2011-09-20 MED ORDER — IBUPROFEN 600 MG PO TABS
600.0000 mg | ORAL_TABLET | Freq: Four times a day (QID) | ORAL | Status: DC
  Administered 2011-09-21 – 2011-09-22 (×6): 600 mg via ORAL
  Filled 2011-09-20 (×6): qty 1

## 2011-09-20 MED ORDER — BENZOCAINE-MENTHOL 20-0.5 % EX AERO: 1.0000 "application " | INHALATION_SPRAY | CUTANEOUS | Status: DC | PRN

## 2011-09-20 MED ORDER — TERBUTALINE SULFATE 1 MG/ML IJ SOLN: 0.2500 mg | Freq: Once | INTRAMUSCULAR | Status: DC | PRN

## 2011-09-20 MED ORDER — PRENATAL MULTIVITAMIN CH
1.0000 | ORAL_TABLET | Freq: Every day | ORAL | Status: DC
  Administered 2011-09-21: 1 via ORAL
  Filled 2011-09-20: qty 1

## 2011-09-20 MED ORDER — LACTATED RINGERS IV SOLN
INTRAVENOUS | Status: DC
  Administered 2011-09-20: 125 mL/h via INTRAVENOUS

## 2011-09-20 MED ORDER — OXYCODONE-ACETAMINOPHEN 5-325 MG PO TABS: 1.0000 | ORAL_TABLET | ORAL | Status: DC | PRN

## 2011-09-20 MED ORDER — EPHEDRINE 5 MG/ML INJ
10.0000 mg | INTRAVENOUS | Status: DC | PRN
  Filled 2011-09-20: qty 4

## 2011-09-20 MED ORDER — IBUPROFEN 600 MG PO TABS
600.0000 mg | ORAL_TABLET | Freq: Four times a day (QID) | ORAL | Status: DC | PRN
  Administered 2011-09-20: 600 mg via ORAL
  Filled 2011-09-20: qty 1

## 2011-09-20 MED ORDER — LIDOCAINE HCL 1.5 % IJ SOLN
INTRAMUSCULAR | Status: DC | PRN
  Administered 2011-09-20 (×2): 5 mL via EPIDURAL

## 2011-09-20 MED ORDER — EPHEDRINE 5 MG/ML INJ: 10.0000 mg | INTRAVENOUS | Status: DC | PRN

## 2011-09-20 MED ORDER — DIPHENHYDRAMINE HCL 50 MG/ML IJ SOLN
12.5000 mg | INTRAMUSCULAR | Status: DC | PRN
  Administered 2011-09-20: 12.5 mg via INTRAVENOUS
  Filled 2011-09-20: qty 1

## 2011-09-20 MED ORDER — OXYTOCIN 20 UNITS IN LACTATED RINGERS INFUSION - SIMPLE
1.0000 m[IU]/min | INTRAVENOUS | Status: DC
  Administered 2011-09-20: 2 m[IU]/min via INTRAVENOUS
  Filled 2011-09-20: qty 1000

## 2011-09-20 MED ORDER — ZOLPIDEM TARTRATE 5 MG PO TABS: 5.0000 mg | ORAL_TABLET | Freq: Every evening | ORAL | Status: DC | PRN

## 2011-09-20 MED ORDER — DIPHENHYDRAMINE HCL 25 MG PO CAPS: 25.0000 mg | ORAL_CAPSULE | Freq: Four times a day (QID) | ORAL | Status: DC | PRN

## 2011-09-20 MED ORDER — FENTANYL 2.5 MCG/ML BUPIVACAINE 1/10 % EPIDURAL INFUSION (WH - ANES)
14.0000 mL/h | INTRAMUSCULAR | Status: DC
  Administered 2011-09-20 (×3): 14 mL/h via EPIDURAL
  Filled 2011-09-20 (×3): qty 60

## 2011-09-20 MED ORDER — ONDANSETRON HCL 4 MG/2ML IJ SOLN: 4.0000 mg | Freq: Four times a day (QID) | INTRAMUSCULAR | Status: DC | PRN

## 2011-09-20 MED ORDER — LACTATED RINGERS IV SOLN: 500.0000 mL | INTRAVENOUS | Status: DC | PRN

## 2011-09-20 MED ORDER — BUTORPHANOL TARTRATE 2 MG/ML IJ SOLN: 2.0000 mg | INTRAMUSCULAR | Status: DC | PRN

## 2011-09-20 MED ORDER — WITCH HAZEL-GLYCERIN EX PADS: 1.0000 "application " | MEDICATED_PAD | CUTANEOUS | Status: DC | PRN

## 2011-09-20 MED ORDER — CITRIC ACID-SODIUM CITRATE 334-500 MG/5ML PO SOLN: 30.0000 mL | ORAL | Status: DC | PRN

## 2011-09-20 MED ORDER — ACETAMINOPHEN 325 MG PO TABS: 650.0000 mg | ORAL_TABLET | ORAL | Status: DC | PRN

## 2011-09-20 MED ORDER — SIMETHICONE 80 MG PO CHEW: 80.0000 mg | CHEWABLE_TABLET | ORAL | Status: DC | PRN

## 2011-09-20 MED ORDER — DIBUCAINE 1 % RE OINT: 1.0000 "application " | TOPICAL_OINTMENT | RECTAL | Status: DC | PRN

## 2011-09-20 MED ORDER — LIDOCAINE HCL (PF) 1 % IJ SOLN
30.0000 mL | INTRAMUSCULAR | Status: DC | PRN
  Filled 2011-09-20: qty 30

## 2011-09-20 MED ORDER — FLEET ENEMA 7-19 GM/118ML RE ENEM: 1.0000 | ENEMA | RECTAL | Status: DC | PRN

## 2011-09-20 MED ORDER — LANOLIN HYDROUS EX OINT: TOPICAL_OINTMENT | CUTANEOUS | Status: DC | PRN

## 2011-09-20 MED ORDER — ALBUTEROL SULFATE HFA 108 (90 BASE) MCG/ACT IN AERS
2.0000 | INHALATION_SPRAY | RESPIRATORY_TRACT | Status: DC | PRN
  Administered 2011-09-20: 2 via RESPIRATORY_TRACT
  Filled 2011-09-20: qty 6.7

## 2011-09-20 MED ORDER — LACTATED RINGERS IV SOLN
500.0000 mL | Freq: Once | INTRAVENOUS | Status: AC
  Administered 2011-09-20: 1000 mL via INTRAVENOUS

## 2011-09-20 MED ORDER — PHENYLEPHRINE 40 MCG/ML (10ML) SYRINGE FOR IV PUSH (FOR BLOOD PRESSURE SUPPORT): 80.0000 ug | PREFILLED_SYRINGE | INTRAVENOUS | Status: DC | PRN

## 2011-09-20 MED ORDER — TETANUS-DIPHTH-ACELL PERTUSSIS 5-2.5-18.5 LF-MCG/0.5 IM SUSP
0.5000 mL | Freq: Once | INTRAMUSCULAR | Status: AC
  Administered 2011-09-21: 0.5 mL via INTRAMUSCULAR

## 2011-09-20 MED ORDER — SENNOSIDES-DOCUSATE SODIUM 8.6-50 MG PO TABS
2.0000 | ORAL_TABLET | Freq: Every day | ORAL | Status: DC
  Administered 2011-09-21: 2 via ORAL

## 2011-09-20 MED ORDER — SODIUM CHLORIDE 0.9 % IV SOLN
2.0000 g | Freq: Once | INTRAVENOUS | Status: AC
  Administered 2011-09-20: 2 g via INTRAVENOUS
  Filled 2011-09-20: qty 2000

## 2011-09-20 MED ORDER — ONDANSETRON HCL 4 MG/2ML IJ SOLN: 4.0000 mg | INTRAMUSCULAR | Status: DC | PRN

## 2011-09-20 MED ORDER — OXYTOCIN BOLUS FROM INFUSION
500.0000 mL | Freq: Once | INTRAVENOUS | Status: DC
  Filled 2011-09-20: qty 500

## 2011-09-20 MED ORDER — ONDANSETRON HCL 4 MG PO TABS: 4.0000 mg | ORAL_TABLET | ORAL | Status: DC | PRN

## 2011-09-20 NOTE — Progress Notes (Signed)
1650 FHR decreased to 90bpm for 4 minutes then gradually returned to baseline.  Dr. Senaida Ores notified.

## 2011-09-20 NOTE — Progress Notes (Signed)
Dr. Senaida Ores notified of pt status, SVE, FHR, deceleration noted and variables, RN interventions, oxygen applied, IV bolus started, pt repositoned, IFSE applied, UC pattern, and pitocin amount.  Will continue to monitor.

## 2011-09-20 NOTE — Progress Notes (Signed)
Patient ID: Casey Price, female   DOB: September 02, 1985, 26 y.o.   MRN: [id number] Pt comfortable with epidural and ampicillin in for +GBS.  AROM--clear.  Attempted IUPC placement, but vertex too engaged to easily pass. Will augment with low dose pitocin.  Cervix 80-90/5+/-1  Contractions 2-5 minutes.  EFM reactive.

## 2011-09-20 NOTE — Anesthesia Procedure Notes (Signed)
Epidural Patient location during procedure: OB Start time: 09/20/2011 12:35 PM  Staffing Anesthesiologist: Brayton Caves R Performed by: anesthesiologist   Preanesthetic Checklist Completed: patient identified, site marked, surgical consent, pre-op evaluation, timeout performed, IV checked, risks and benefits discussed and monitors and equipment checked  Epidural Patient position: sitting Prep: site prepped and draped and DuraPrep Patient monitoring: continuous pulse ox and blood pressure Approach: midline Injection technique: LOR air and LOR saline  Needle:  Needle type: Tuohy  Needle gauge: 17 G Needle length: 9 cm Needle insertion depth: 9 cm Catheter type: closed end flexible Catheter size: 19 Gauge Catheter at skin depth: 14 cm Test dose: negative  Assessment Events: blood not aspirated, injection not painful, no injection resistance, negative IV test and no paresthesia  Additional Notes Patient identified.  Risk benefits discussed including failed block, incomplete pain control, headache, nerve damage, paralysis, blood pressure changes, nausea, vomiting, reactions to medication both toxic or allergic, and postpartum back pain.  Patient expressed understanding and wished to proceed.  All questions were answered.  Sterile technique used throughout procedure and epidural site dressed with sterile barrier dressing. No paresthesia or other complications noted.The patient did not experience any signs of intravascular injection such as tinnitus or metallic taste in mouth nor signs of intrathecal spread such as rapid motor block. Please see nursing notes for vital signs.

## 2011-09-20 NOTE — H&P (Signed)
Casey Price is a 26 y.o. female G2P0010 at 37+weeks (EDD 10/10/11 by LMP c/w 18 week [place]) presenting for contractions and cervical change to 5cm per Dr. Ellyn Hack on her office exam.  Pt's prenatal care complicated by h/o mental illness with diagnosis of bipolar with paranoid tendancies.  Has been homicidal and suicidal in past.  She is GBS positive,treated in L&D.  Her BP have been elevated slightly to diastolics of 90's but labs normal and a 24 hour urine on 09/13/11 showed only 216mg  of protein.  She has a h/o ASCUS pap with +HR HPV--colpo ok but will need repeat postpartum.    Maternal Medical History:  Reason for admission: Reason for admission: contractions.  Contractions: Onset was 3-5 hours ago.   Perceived severity is strong.      OB History    Grav Para Term Preterm Abortions TAB SAB Ect Mult Living   2    1  1        2007  SAB  Past Medical History  Diagnosis Date  . Anxiety   . Abnormal Pap smear   . Depression   . Bipolar disorder schizophrenia  . Bipolar 1 disorder     No meds   . Schizophrenia     no meds   . Asthma   . Epilepsy     "grew out of it, no problem since age 70"  . Headache   . Trichomonas    Past Surgical History  Procedure Date  . Other surgical history     cyst removed from her" tailbone"  . Broken leg     right repair and ankle- no surgical repair  . Incision and drainage of wound     pyelonidal cyst   Family History: family history includes Anesthesia problems in her other; Anxiety disorder in her mother; COPD in her father; Diabetes in her father and sister; Hyperlipidemia in her mother; and Hypertension in her father and mother. Social History:  reports that she has been smoking Cigarettes.  She has a 3.25 pack-year smoking history. She has never used smokeless tobacco. She reports that she does not drink alcohol or use illicit drugs.  ROS    Blood pressure 147/96, pulse 117, temperature 98.1 F (36.7 C), temperature source Oral, resp.  rate 18, height 5\' 4"  (1.626 m), weight 131.543 kg (290 lb), last menstrual period 01/03/2011, SpO2 99.00%. Maternal Exam:  Uterine Assessment: Contraction strength is firm.  Contraction frequency is regular.   Abdomen: Patient reports no abdominal tenderness. Fetal presentation: vertex  Introitus: Normal vulva. Normal vagina.    Physical Exam  Constitutional: She is oriented to person, place, and time. She appears well-developed and well-nourished.  Cardiovascular: Normal rate and regular rhythm.   Respiratory: Effort normal and breath sounds normal.  GI: Soft. Bowel sounds are normal.  Genitourinary: Vagina normal and uterus normal.       5cm per Dr. Ellyn Hack in office  Neurological: She is alert and oriented to person, place, and time.  Psychiatric: She has a normal mood and affect. Her behavior is normal.    Prenatal labs: ABO, Rh: O/Positive/-- (09/28 0000) Antibody: Negative (09/28 0000) Rubella: Immune (09/28 0000) RPR: Nonreactive (09/28 0000)  HBsAg: Negative (09/28 0000)  HIV: Non-reactive (09/28 0000)  GBS: Positive (01/31 0000)  One hour glucola 111 Quad screen WNL  Assessment/Plan: Pt admitted and received epidural for pain, will perform AROM after ampicillin on board for +GBS. SW consult postpartum for psych issues.   Huel Cote  W 09/20/2011, 1:08 PM

## 2011-09-20 NOTE — Anesthesia Preprocedure Evaluation (Signed)
Anesthesia Evaluation  Patient identified by MRN, date of birth, ID band Patient awake    Reviewed: Allergy & Precautions, H&P , Patient's Chart, lab work & pertinent test results  Airway Mallampati: IV TM Distance: >3 FB Neck ROM: full    Dental No notable dental hx.    Pulmonary neg pulmonary ROS, asthma ,  clear to auscultation  Pulmonary exam normal       Cardiovascular neg cardio ROS regular Normal    Neuro/Psych  Headaches, PSYCHIATRIC DISORDERS Negative Neurological ROS  Negative Psych ROS   GI/Hepatic negative GI ROS, Neg liver ROS,   Endo/Other  Negative Endocrine ROSMorbid obesity  Renal/GU negative Renal ROS     Musculoskeletal   Abdominal   Peds  Hematology negative hematology ROS (+)   Anesthesia Other Findings   Reproductive/Obstetrics (+) Pregnancy                           Anesthesia Physical Anesthesia Plan  ASA: III  Anesthesia Plan: Epidural   Post-op Pain Management:    Induction:   Airway Management Planned:   Additional Equipment:   Intra-op Plan:   Post-operative Plan:   Informed Consent: I have reviewed the patients History and Physical, chart, labs and discussed the procedure including the risks, benefits and alternatives for the proposed anesthesia with the patient or authorized representative who has indicated his/her understanding and acceptance.     Plan Discussed with:   Anesthesia Plan Comments:         Anesthesia Quick Evaluation

## 2011-09-21 LAB — CBC
HCT: 35.8 % — ABNORMAL LOW (ref 36.0–46.0)
Hemoglobin: 11.6 g/dL — ABNORMAL LOW (ref 12.0–15.0)
WBC: 20.5 10*3/uL — ABNORMAL HIGH (ref 4.0–10.5)

## 2011-09-21 NOTE — Anesthesia Postprocedure Evaluation (Signed)
Anesthesia Post Note  Patient: Casey [employer]  Procedure(s) Performed: * No procedures listed *  Anesthesia type: Epidural  Patient location: Mother/Baby  Post pain: Pain level controlled  Post assessment: Post-op Vital signs reviewed  Last Vitals:  Filed Vitals:   09/21/11 0526  BP: 135/91  Pulse: 103  Temp: 36.8 C  Resp: 20    Post vital signs: Reviewed  Level of consciousness: awake  Complications: No apparent anesthesia complications
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
## 2011-09-21 NOTE — Progress Notes (Signed)
Post Partum Day 1 Subjective: voiding, tolerating PO and feeling tired from baby being up all night  Objective: Blood pressure 135/91, pulse 103, temperature 98.3 F (36.8 C), temperature source Oral, resp. rate 20, height 5\' 4"  (1.626 m), weight 131.543 kg (290 lb), last menstrual period 01/03/2011, SpO2 99.00%, unknown if currently breastfeeding.  Physical Exam:  General: fatigued Lochia: appropriate Uterine Fundus: firm    Basename 09/21/11 0600 09/20/11 1140  HGB 11.6* 12.9  HCT 35.8* 39.0    Assessment/Plan: Plan for discharge tomorrow SW consult today for psych history--has good support network   LOS: 1 day   Casey Price W 09/21/2011, 6:59 AM

## 2011-09-21 NOTE — Progress Notes (Signed)
UR Chart review completed.  

## 2011-09-21 NOTE — Addendum Note (Signed)
Addendum  created 09/21/11 1101 by Velna Hatchet, MD   Modules edited:Notes Section

## 2011-09-22 LAB — COMPREHENSIVE METABOLIC PANEL
ALT: 9 U/L (ref 0–35)
CO2: 23 mEq/L (ref 19–32)
Calcium: 9.8 mg/dL (ref 8.4–10.5)
Creatinine, Ser: 0.62 mg/dL (ref 0.50–1.10)
GFR calc Af Amer: >90 mL/min (ref >90)
GFR calc non Af Amer: >90 mL/min (ref >90)
Glucose, Bld: 84 mg/dL (ref 70–99)

## 2011-09-22 LAB — CBC
Hemoglobin: 11.6 g/dL — ABNORMAL LOW (ref 12.0–15.0)
MCH: 27.5 pg (ref 26.0–34.0)
MCV: 85.1 fL (ref 78.0–100.0)
RBC: 4.22 MIL/uL (ref 3.87–5.11)

## 2011-09-22 MED ORDER — BENZOCAINE-MENTHOL 20-0.5 % EX AERO
INHALATION_SPRAY | CUTANEOUS | Status: AC
  Administered 2011-09-22: 06:00:00
  Filled 2011-09-22: qty 56

## 2011-09-22 MED ORDER — IBUPROFEN 600 MG PO TABS: 600.0000 mg | ORAL_TABLET | Freq: Four times a day (QID) | ORAL | Status: AC

## 2011-09-22 NOTE — Progress Notes (Signed)
PPD #2 Feels ok, feels like she may be getting a cold Afeb, HR 100s, BP 130-150/80-90 Will check PIH labs, as long as they are normal will d/c home and f/u one week to check BP

## 2011-09-22 NOTE — Discharge Summary (Signed)
Obstetric Discharge Summary Reason for Admission: onset of labor Prenatal Procedures: none Intrapartum Procedures: spontaneous vaginal delivery Postpartum Procedures: none Complications-Operative and Postpartum: 1st degree perineal laceration Hemoglobin  Date Value Range Status  09/22/2011 11.6* 12.0-15.0 (g/dL) Final     HCT  Date Value Range Status  09/22/2011 35.9* 36.0-46.0 (%) Final    Discharge Diagnoses: Term Pregnancy-delivered  Discharge Information: Date: 09/22/2011 Activity: pelvic rest Diet: routine Medications: Ibuprofen Condition: stable Instructions: refer to practice specific booklet Discharge to: home Follow-up Information    Follow up with Oliver Pila, MD. Schedule an appointment as soon as possible for a visit in 5 days. (to check blood pressure)    Contact information:   [address], [address] [place] [address] [phone]          Newborn Data: Live born female  Birth Weight: 6 lb 8.2 oz (2954 g) APGAR: 8, 9  Home with mother.  Brad Lieurance D 09/22/2011, 11:05 AM

## 2011-11-24 ENCOUNTER — Encounter (HOSPITAL_BASED_OUTPATIENT_CLINIC_OR_DEPARTMENT_OTHER): Payer: Self-pay | Admitting: *Deleted

## 2011-11-24 ENCOUNTER — Emergency Department (HOSPITAL_BASED_OUTPATIENT_CLINIC_OR_DEPARTMENT_OTHER)
Admission: EM | Admit: 2011-11-24 | Discharge: 2011-11-24 | Disposition: A | Discharge status: Home / Self Care | Payer: Medicaid Other | Attending: Emergency Medicine | Admitting: Emergency Medicine

## 2011-11-24 ENCOUNTER — Emergency Department (INDEPENDENT_AMBULATORY_CARE_PROVIDER_SITE_OTHER): Payer: Medicaid Other

## 2011-11-24 DIAGNOSIS — J40 Bronchitis, not specified as acute or chronic: Secondary | ICD-10-CM

## 2011-11-24 DIAGNOSIS — J45909 Unspecified asthma, uncomplicated: Secondary | ICD-10-CM | POA: Insufficient documentation

## 2011-11-24 DIAGNOSIS — M545 Low back pain, unspecified: Secondary | ICD-10-CM | POA: Insufficient documentation

## 2011-11-24 DIAGNOSIS — R05 Cough: Secondary | ICD-10-CM

## 2011-11-24 DIAGNOSIS — M546 Pain in thoracic spine: Secondary | ICD-10-CM | POA: Insufficient documentation

## 2011-11-24 DIAGNOSIS — R059 Cough, unspecified: Secondary | ICD-10-CM | POA: Insufficient documentation

## 2011-11-24 DIAGNOSIS — F319 Bipolar disorder, unspecified: Secondary | ICD-10-CM | POA: Insufficient documentation

## 2011-11-24 LAB — URINALYSIS, ROUTINE W REFLEX MICROSCOPIC
Bilirubin Urine: NEGATIVE
Glucose, UA: NEGATIVE mg/dL
Specific Gravity, Urine: 1.014 (ref 1.005–1.030)
pH: 6 (ref 5.0–8.0)

## 2011-11-24 LAB — URINE MICROSCOPIC-ADD ON

## 2011-11-24 MED ORDER — AZITHROMYCIN 250 MG PO TABS: 250.0000 mg | ORAL_TABLET | Freq: Every day | ORAL | Status: AC

## 2011-11-24 MED ORDER — HYDROCODONE-ACETAMINOPHEN 5-325 MG PO TABS: 2.0000 | ORAL_TABLET | ORAL | Status: AC | PRN

## 2011-11-24 MED ORDER — IPRATROPIUM BROMIDE 0.02 % IN SOLN
0.5000 mg | Freq: Once | RESPIRATORY_TRACT | Status: AC
  Administered 2011-11-24: 0.5 mg via RESPIRATORY_TRACT
  Filled 2011-11-24: qty 2.5

## 2011-11-24 MED ORDER — ALBUTEROL SULFATE HFA 108 (90 BASE) MCG/ACT IN AERS: 1.0000 | INHALATION_SPRAY | Freq: Four times a day (QID) | RESPIRATORY_TRACT | Status: DC | PRN

## 2011-11-24 MED ORDER — ALBUTEROL SULFATE (5 MG/ML) 0.5% IN NEBU
5.0000 mg | INHALATION_SOLUTION | Freq: Once | RESPIRATORY_TRACT | Status: AC
  Administered 2011-11-24: 5 mg via RESPIRATORY_TRACT
  Filled 2011-11-24: qty 1

## 2011-11-24 NOTE — Discharge Instructions (Signed)
Bronchitis Bronchitis is a problem of the air tubes leading to your lungs. This problem makes it hard for air to get in and out of the lungs. You may cough a lot because your air tubes are narrow. Going without care can cause lasting (chronic) bronchitis. HOME CARE   Drink enough fluids to keep your pee (urine) clear or pale yellow.   Use a cool mist humidifier.   Quit smoking if you smoke. If you keep smoking, the bronchitis might not get better.   Only take medicine as told by your doctor.  GET HELP RIGHT AWAY IF:   Coughing keeps you awake.   You start to wheeze.   You become more sick or weak.   You have a hard time breathing or get short of breath.   You cough up blood.   Coughing lasts more than 2 weeks.   You have a fever.   Your baby is older than 3 months with a rectal temperature of 102 F (38.9 C) or higher.   Your baby is 40 months old or younger with a rectal temperature of 100.4 F (38 C) or higher.  MAKE SURE YOU:  Understand these instructions.   Will watch your condition.   Will get help right away if you are not doing well or get worse.  Document Released: 01/23/2008 Document Revised: 07/26/2011 Document Reviewed: 07/08/2009 ExitCare Patient Information 2012 ExitCare, LLCBack Pain, Adult Low back pain is very common. About 1 in 5 people have back pain.The cause of low back pain is rarely dangerous. The pain often gets better over time.About half of people with a sudden onset of back pain feel better in just 2 weeks. About 8 in 10 people feel better by 6 weeks.  CAUSES Some common causes of back pain include:  Strain of the muscles or ligaments supporting the spine.   Wear and tear (degeneration) of the spinal discs.   Arthritis.   Direct injury to the back.  DIAGNOSIS Most of the time, the direct cause of low back pain is not known.However, back pain can be treated effectively even when the exact cause of the pain is unknown.Answering your  caregiver's questions about your overall health and symptoms is one of the most accurate ways to make sure the cause of your pain is not dangerous. If your caregiver needs more information, he or she may order lab work or imaging tests (X-rays or MRIs).However, even if imaging tests show changes in your back, this usually does not require surgery. HOME CARE INSTRUCTIONS For many people, back pain returns.Since low back pain is rarely dangerous, it is often a condition that people can learn to [facility] their own.   Remain active. It is stressful on the back to sit or stand in one place. Do not sit, drive, or stand in one place for more than 30 minutes at a time. Take short walks on level surfaces as soon as pain allows.Try to increase the length of time you walk each day.   Do not stay in bed.Resting more than 1 or 2 days can delay your recovery.   Do not avoid exercise or work.Your body is made to move.It is not dangerous to be active, even though your back may hurt.Your back will likely heal faster if you return to being active before your pain is gone.   Pay attention to your body when you bend and lift. Many people have less discomfortwhen lifting if they bend their knees, keep the load  close to their bodies,and avoid twisting. Often, the most comfortable positions are those that put less stress on your recovering back.   Find a comfortable position to sleep. Use a firm mattress and lie on your side with your knees slightly bent. If you lie on your back, put a pillow under your knees.   Only take over-the-counter or prescription medicines as directed by your caregiver. Over-the-counter medicines to reduce pain and inflammation are often the most helpful.Your caregiver may prescribe muscle relaxant drugs.These medicines help dull your pain so you can more quickly return to your normal activities and healthy exercise.   Put ice on the injured area.   Put ice in a plastic bag.    Place a towel between your skin and the bag.   Leave the ice on for 15 to 20 minutes, 3 to 4 times a day for the first 2 to 3 days. After that, ice and heat may be alternated to reduce pain and spasms.   Ask your caregiver about trying back exercises and gentle massage. This may be of some benefit.   Avoid feeling anxious or stressed.Stress increases muscle tension and can worsen back pain.It is important to recognize when you are anxious or stressed and learn ways to manage it.Exercise is a great option.  SEEK MEDICAL CARE IF:  You have pain that is not relieved with rest or medicine.   You have pain that does not improve in 1 week.   You have new symptoms.   You are generally not feeling well.  SEEK IMMEDIATE MEDICAL CARE IF:   You have pain that radiates from your back into your legs.   You develop new bowel or bladder control problems.   You have unusual weakness or numbness in your arms or legs.   You develop nausea or vomiting.   You develop abdominal pain.   You feel faint.  Document Released: 08/06/2005 Document Revised: 07/26/2011 Document Reviewed: 12/25/2010 [facility] Patient Information 2012 [place], [place].Marland Kitchen

## 2011-11-24 NOTE — ED Provider Notes (Signed)
History     CSN: [id number]  Arrival date & time 11/24/11  1604   First MD Initiated Contact with Patient 11/24/11 1812      Chief Complaint  Patient presents with  . Back Pain    (Consider location/radiation/quality/duration/timing/severity/associated sxs/prior treatment) Patient is a 26 y.o. female presenting with back pain and cough. The history is provided by the patient. No language interpreter was used.  Back Pain  This is a recurrent problem. The pain is associated with no known injury. The pain is present in the thoracic spine and lumbar spine. The quality of the pain is described as stabbing. The pain does not radiate. Pertinent negatives include no chest pain.  Cough This is a new problem. The current episode started 2 days ago. The problem occurs constantly. The problem has been gradually worsening. The cough is non-productive. There has been no fever. Pertinent negatives include no chest pain. She has tried nothing for the symptoms. The treatment provided no relief. She is a smoker. Her past medical history is significant for bronchitis and asthma. Her past medical history does not include pneumonia.    Past Medical History  Diagnosis Date  . Anxiety   . Abnormal Pap smear   . Depression   . Bipolar disorder schizophrenia  . Bipolar 1 disorder     No meds   . Schizophrenia     no meds   . Asthma   . Epilepsy     "grew out of it, no problem since age 44"  . Headache   . Trichomonas     Past Surgical History  Procedure Date  . Other surgical history     cyst removed from her" tailbone"  . Broken leg     right repair and ankle- no surgical repair  . Incision and drainage of wound     pyelonidal cyst    Family History  Problem Relation Age of Onset  . Hypertension Mother   . Hyperlipidemia Mother   . Anxiety disorder Mother   . Hypertension Father   . Diabetes Father   . COPD Father   . Diabetes Sister   . Anesthesia problems Other     History    Substance Use Topics  . Smoking status: Current Everyday Smoker -- 0.2 packs/day for 13 years    Types: Cigarettes  . Smokeless tobacco: Never Used  . Alcohol Use: No     prior to preg    OB History    Grav Para Term Preterm Abortions TAB SAB Ect Mult Living   2 1 1  1  1   1       Review of Systems  Respiratory: Positive for cough.   Cardiovascular: Negative for chest pain.  Musculoskeletal: Positive for back pain.  All other systems reviewed and are negative.    Allergies  Betadine; Contrast media; Darvocet; Iodine; and Shellfish allergy  Home Medications   Current Outpatient Rx  Name Route Sig Dispense Refill  . ALBUTEROL SULFATE HFA 108 (90 BASE) MCG/ACT IN AERS Inhalation Inhale 2 puffs into the lungs every 4 (four) hours as needed. Shortness of breath    . ETONOGESTREL 68 MG [place] IMPL Subcutaneous Inject 1 each into the skin once.      BP 120/72  Pulse 79  Temp(Src) 98.1 F (36.7 C) (Oral)  Resp 22  Ht 5\' 4"  (1.626 m)  Wt 261 lb 2 oz (118.446 kg)  BMI 44.82 kg/m2  SpO2 96%  LMP 10/24/2011  Breastfeeding? No  Physical Exam  Nursing note and vitals reviewed. Constitutional: She is oriented to person, place, and time. She appears well-developed and well-nourished.  HENT:  Head: Normocephalic and atraumatic.  Right Ear: External ear normal.  Left Ear: External ear normal.  Nose: Nose normal.  Mouth/Throat: Oropharynx is clear and moist.  Eyes: Conjunctivae and EOM are normal. Pupils are equal, round, and reactive to light.  Neck: Normal range of motion. Neck supple.  Cardiovascular: Normal rate and normal heart sounds.   Pulmonary/Chest: Effort normal.  Abdominal: Soft. Bowel sounds are normal.  Musculoskeletal: Normal range of motion.  Neurological: She is alert and oriented to person, place, and time.  Skin: Skin is warm.  Psychiatric: She has a normal mood and affect.    ED Course  Procedures (including critical care time)  Labs Reviewed   URINALYSIS, ROUTINE W REFLEX MICROSCOPIC - Abnormal; Notable for the following:    APPearance CLOUDY (*)    Hgb urine dipstick SMALL (*)    Leukocytes, UA SMALL (*)    All other components within normal limits  URINE MICROSCOPIC-ADD ON - Abnormal; Notable for the following:    Squamous Epithelial / LPF MANY (*)    Bacteria, UA MANY (*)    All other components within normal limits  PREGNANCY, URINE   Dg Chest 2 View  11/24/2011  *RADIOLOGY REPORT*  Clinical Data: Cough  CHEST - 2 VIEW  Comparison: 01/10/2011  Findings: Heart size appears normal.  No pleural effusion or pulmonary edema.  Bronchitic changes are noted bilaterally.  No airspace consolidation.  Review of the visualized osseous structures is unremarkable.  IMPRESSION:  1.  Bronchitic changes. 2.  No pneumonia.  Original Report Authenticated By: Rosealee Albee, M.D.     No diagnosis found.    MDM  Pt given rx for zithromax and hydrocodone,  Pt advised to use albuterol inhaler        [name] [place], [place] 11/24/11 2033

## 2011-11-24 NOTE — ED Notes (Addendum)
Pt states she has had back pain since having her epidural in Jan. Also c/o H/A for a couple of days. PERL. Coughing, sneezing d/t allergies.

## 2011-11-27 NOTE — ED Provider Notes (Signed)
History/physical exam/procedure(s) were performed by non-physician practitioner and as supervising physician I was immediately available for consultation/collaboration. I have reviewed all notes and am in agreement with care and plan.   Kambryn Dapolito S Dicarlo, MD 11/27/11 1735 

## 2012-09-16 ENCOUNTER — Emergency Department (HOSPITAL_COMMUNITY)
Admission: EM | Admit: 2012-09-16 | Discharge: 2012-09-16 | Disposition: A | Discharge status: Home / Self Care | Payer: BC Managed Care – PPO | Attending: Emergency Medicine | Admitting: Emergency Medicine

## 2012-09-16 ENCOUNTER — Encounter (HOSPITAL_COMMUNITY): Payer: Self-pay | Admitting: *Deleted

## 2012-09-16 DIAGNOSIS — F17200 Nicotine dependence, unspecified, uncomplicated: Secondary | ICD-10-CM | POA: Insufficient documentation

## 2012-09-16 DIAGNOSIS — Z8669 Personal history of other diseases of the nervous system and sense organs: Secondary | ICD-10-CM | POA: Insufficient documentation

## 2012-09-16 DIAGNOSIS — J45909 Unspecified asthma, uncomplicated: Secondary | ICD-10-CM | POA: Insufficient documentation

## 2012-09-16 DIAGNOSIS — J029 Acute pharyngitis, unspecified: Secondary | ICD-10-CM | POA: Insufficient documentation

## 2012-09-16 DIAGNOSIS — R05 Cough: Secondary | ICD-10-CM | POA: Insufficient documentation

## 2012-09-16 DIAGNOSIS — Z79899 Other long term (current) drug therapy: Secondary | ICD-10-CM | POA: Insufficient documentation

## 2012-09-16 DIAGNOSIS — Z8619 Personal history of other infectious and parasitic diseases: Secondary | ICD-10-CM | POA: Insufficient documentation

## 2012-09-16 DIAGNOSIS — J069 Acute upper respiratory infection, unspecified: Secondary | ICD-10-CM | POA: Insufficient documentation

## 2012-09-16 DIAGNOSIS — Z8659 Personal history of other mental and behavioral disorders: Secondary | ICD-10-CM | POA: Insufficient documentation

## 2012-09-16 DIAGNOSIS — IMO0001 Reserved for inherently not codable concepts without codable children: Secondary | ICD-10-CM | POA: Insufficient documentation

## 2012-09-16 DIAGNOSIS — M545 Low back pain, unspecified: Secondary | ICD-10-CM | POA: Insufficient documentation

## 2012-09-16 DIAGNOSIS — R059 Cough, unspecified: Secondary | ICD-10-CM | POA: Insufficient documentation

## 2012-09-16 MED ORDER — PSEUDOEPHEDRINE HCL ER 120 MG PO TB12
120.0000 mg | ORAL_TABLET | Freq: Once | ORAL | Status: AC
  Administered 2012-09-16: 120 mg via ORAL
  Filled 2012-09-16: qty 1

## 2012-09-16 MED ORDER — ALBUTEROL SULFATE HFA 108 (90 BASE) MCG/ACT IN AERS
2.0000 | INHALATION_SPRAY | Freq: Once | RESPIRATORY_TRACT | Status: AC
  Administered 2012-09-16: 2 via RESPIRATORY_TRACT
  Filled 2012-09-16 (×2): qty 6.7

## 2012-09-16 MED ORDER — PSEUDOEPHEDRINE HCL ER 120 MG PO TB12: 120.0000 mg | ORAL_TABLET | Freq: Once | ORAL | Status: DC

## 2012-09-16 NOTE — ED Notes (Signed)
Pt states she has had cough, congestion x 2 days.  States she has been around people with the flu recently

## 2012-09-16 NOTE — ED Provider Notes (Signed)
History     CSN: [id number]  Arrival date & time 09/16/12  0017   First MD Initiated Contact with Patient 09/16/12 0101      Chief Complaint  Patient presents with  . Nasal Congestion  . Cough    (Consider location/radiation/quality/duration/timing/severity/associated sxs/prior treatment) HPI Comments: Patient with 2, days of gradual onset of nasal congestion, cough, myalgias, and low back pain.  Denies any elevation in fever, or rapid onset of symptoms.   Patient is a 27 y.o. female presenting with cough. The history is provided by the patient.  Cough This is a new problem. The current episode started more than 2 days ago. The problem occurs every few minutes. The cough is non-productive. There has been no fever. Associated symptoms include rhinorrhea, sore throat and myalgias. Pertinent negatives include no chest pain, no chills, no ear congestion, no headaches, no shortness of breath, no wheezing and no eye redness. She has tried nothing for the symptoms. The treatment provided no relief.    Past Medical History  Diagnosis Date  . Anxiety   . Abnormal Pap smear   . Depression   . Bipolar disorder schizophrenia  . Bipolar 1 disorder     No meds   . Schizophrenia     no meds   . Asthma   . Epilepsy     "grew out of it, no problem since age 61"  . Headache   . Trichomonas     Past Surgical History  Procedure Date  . Other surgical history     cyst removed from her" tailbone"  . Broken leg     right repair and ankle- no surgical repair  . Incision and drainage of wound     pyelonidal cyst    Family History  Problem Relation Age of Onset  . Hypertension Mother   . Hyperlipidemia Mother   . Anxiety disorder Mother   . Hypertension Father   . Diabetes Father   . COPD Father   . Diabetes Sister   . Anesthesia problems Other     History  Substance Use Topics  . Smoking status: Current Every Day Smoker -- 1.0 packs/day for 13 years    Types: Cigarettes  .  Smokeless tobacco: Never Used  . Alcohol Use: No     Comment: prior to preg    OB History    Grav Para Term Preterm Abortions TAB SAB Ect Mult Living   2 1 1  1  1   1       Review of Systems  Constitutional: Negative for chills.  HENT: Positive for sore throat and rhinorrhea.   Eyes: Negative for redness.  Respiratory: Positive for cough. Negative for shortness of breath and wheezing.   Cardiovascular: Negative for chest pain.  Gastrointestinal: Negative for nausea and vomiting.  Genitourinary: Negative for dysuria, frequency and flank pain.  Musculoskeletal: Positive for myalgias and back pain.  Skin: Negative for wound.  Neurological: Negative for headaches.    Allergies  Betadine; Contrast media; Darvocet; Iodine; and Shellfish allergy  Home Medications   Current Outpatient Rx  Name  Route  Sig  Dispense  Refill  . ALBUTEROL SULFATE HFA 108 (90 BASE) MCG/ACT IN AERS   Inhalation   Inhale 2 puffs into the lungs every 4 (four) hours as needed. Shortness of breath while sick         . ETONOGESTREL 68 MG [place] IMPL   Subcutaneous   Inject 1 each into the skin once.  BP 127/72  Pulse 88  Temp 98.1 F (36.7 C) (Oral)  Resp 18  SpO2 98%  Breastfeeding? Unknown  Physical Exam  Constitutional: She appears well-developed and well-nourished.  HENT:  Head: Normocephalic.  Eyes: Pupils are equal, round, and reactive to light.  Neck: Normal range of motion.  Cardiovascular: Normal rate.   Pulmonary/Chest: Effort normal and breath sounds normal. No respiratory distress.       Nonproductive dry cough  Abdominal: Soft.        Moderately obese  Musculoskeletal: Normal range of motion.  Neurological: She is alert.  Skin: Skin is warm.    ED Course  Procedures (including critical care time)  Labs Reviewed - No data to display No results found.   1. URI (upper respiratory infection)       MDM   Upper respiratory tract  infection        Arman Filter, NP 09/16/12 0128

## 2012-09-16 NOTE — ED Provider Notes (Signed)
Medical screening examination/treatment/procedure(s) were performed by non-physician practitioner and as supervising physician I was immediately available for consultation/collaboration.  Sunnie Nielsen, MD 09/16/12 [phone]

## 2012-09-30 ENCOUNTER — Encounter (HOSPITAL_BASED_OUTPATIENT_CLINIC_OR_DEPARTMENT_OTHER): Payer: Self-pay | Admitting: *Deleted

## 2012-09-30 ENCOUNTER — Emergency Department (HOSPITAL_BASED_OUTPATIENT_CLINIC_OR_DEPARTMENT_OTHER): Payer: BC Managed Care – PPO

## 2012-09-30 ENCOUNTER — Emergency Department (HOSPITAL_BASED_OUTPATIENT_CLINIC_OR_DEPARTMENT_OTHER)
Admission: EM | Admit: 2012-09-30 | Discharge: 2012-09-30 | Disposition: A | Discharge status: Home / Self Care | Payer: BC Managed Care – PPO | Attending: Emergency Medicine | Admitting: Emergency Medicine

## 2012-09-30 DIAGNOSIS — N83202 Unspecified ovarian cyst, left side: Secondary | ICD-10-CM

## 2012-09-30 DIAGNOSIS — D219 Benign neoplasm of connective and other soft tissue, unspecified: Secondary | ICD-10-CM

## 2012-09-30 DIAGNOSIS — Z8659 Personal history of other mental and behavioral disorders: Secondary | ICD-10-CM | POA: Insufficient documentation

## 2012-09-30 DIAGNOSIS — Z8669 Personal history of other diseases of the nervous system and sense organs: Secondary | ICD-10-CM | POA: Insufficient documentation

## 2012-09-30 DIAGNOSIS — J45909 Unspecified asthma, uncomplicated: Secondary | ICD-10-CM | POA: Insufficient documentation

## 2012-09-30 DIAGNOSIS — N83209 Unspecified ovarian cyst, unspecified side: Secondary | ICD-10-CM | POA: Insufficient documentation

## 2012-09-30 DIAGNOSIS — Z8619 Personal history of other infectious and parasitic diseases: Secondary | ICD-10-CM | POA: Insufficient documentation

## 2012-09-30 DIAGNOSIS — N898 Other specified noninflammatory disorders of vagina: Secondary | ICD-10-CM | POA: Insufficient documentation

## 2012-09-30 DIAGNOSIS — D252 Subserosal leiomyoma of uterus: Secondary | ICD-10-CM | POA: Insufficient documentation

## 2012-09-30 DIAGNOSIS — Z3202 Encounter for pregnancy test, result negative: Secondary | ICD-10-CM | POA: Insufficient documentation

## 2012-09-30 DIAGNOSIS — F17200 Nicotine dependence, unspecified, uncomplicated: Secondary | ICD-10-CM | POA: Insufficient documentation

## 2012-09-30 LAB — WET PREP, GENITAL: Trich, Wet Prep: NONE SEEN

## 2012-09-30 LAB — URINE MICROSCOPIC-ADD ON

## 2012-09-30 LAB — URINALYSIS, ROUTINE W REFLEX MICROSCOPIC
Bilirubin Urine: NEGATIVE
Ketones, ur: NEGATIVE mg/dL
Nitrite: NEGATIVE
Protein, ur: NEGATIVE mg/dL
Urobilinogen, UA: 0.2 mg/dL (ref 0.0–1.0)
pH: 6 (ref 5.0–8.0)

## 2012-09-30 MED ORDER — HYDROCODONE-ACETAMINOPHEN 5-325 MG PO TABS
2.0000 | ORAL_TABLET | Freq: Once | ORAL | Status: AC
  Administered 2012-09-30: 2 via ORAL
  Filled 2012-09-30: qty 2

## 2012-09-30 MED ORDER — IBUPROFEN 800 MG PO TABS: 800.0000 mg | ORAL_TABLET | Freq: Three times a day (TID) | ORAL | Status: AC

## 2012-09-30 MED ORDER — HYDROCODONE-ACETAMINOPHEN 5-325 MG PO TABS: 2.0000 | ORAL_TABLET | ORAL | Status: AC | PRN

## 2012-09-30 NOTE — ED Provider Notes (Signed)
Medical screening examination/treatment/procedure(s) were performed by non-physician practitioner and as supervising physician I was immediately available for consultation/collaboration.   Tomicka Lover, MD 09/30/12 1837 

## 2012-09-30 NOTE — ED Provider Notes (Signed)
History     CSN: [id number]  Arrival date & time 09/30/12  1401   First MD Initiated Contact with Patient 09/30/12 1432      Chief Complaint  Patient presents with  . Abdominal Pain    (Consider location/radiation/quality/duration/timing/severity/associated sxs/prior treatment) Patient is a 27 y.o. female presenting with abdominal pain. The history is provided by the patient. No language interpreter was used.  Abdominal Pain Pain location:  LLQ and suprapubic Pain quality: aching   Pain radiates to:  Does not radiate Pain severity:  Moderate Onset quality:  Sudden Timing:  Constant Progression:  Worsening Chronicity:  New Relieved by:  Nothing Worsened by:  Nothing tried Associated symptoms: vaginal bleeding   Associated symptoms: no vomiting   Risk factors: not pregnant   Pt reports she has pelvic pain and vaginal bleeding.   Pt reports she has implanon.  Pt reports she thinks it causes her to bleed and causes her to have mood swings  Past Medical History  Diagnosis Date  . Anxiety   . Abnormal Pap smear   . Depression   . Bipolar disorder schizophrenia  . Bipolar 1 disorder     No meds   . Schizophrenia     no meds   . Asthma   . Epilepsy     "grew out of it, no problem since age 67"  . Headache   . Trichomonas     Past Surgical History  Procedure Laterality Date  . Other surgical history      cyst removed from her" tailbone"  . Broken leg      right repair and ankle- no surgical repair  . Incision and drainage of wound      pyelonidal cyst    Family History  Problem Relation Age of Onset  . Hypertension Mother   . Hyperlipidemia Mother   . Anxiety disorder Mother   . Hypertension Father   . Diabetes Father   . COPD Father   . Diabetes Sister   . Anesthesia problems Other     History  Substance Use Topics  . Smoking status: Current Every Day Smoker -- 1.00 packs/day for 13 years    Types: Cigarettes  . Smokeless tobacco: Never Used  .  Alcohol Use: No     Comment: prior to preg    OB History   Grav Para Term Preterm Abortions TAB SAB Ect Mult Living   2 1 1  1  1   1       Review of Systems  Gastrointestinal: Positive for abdominal pain. Negative for vomiting.  Genitourinary: Positive for vaginal bleeding.    Allergies  Betadine; Contrast media; Darvocet; Iodine; and Shellfish allergy  Home Medications   Current Outpatient Rx  Name  Route  Sig  Dispense  Refill  . EXPIRED: albuterol (PROVENTIL HFA;VENTOLIN HFA) 108 (90 BASE) MCG/ACT inhaler   Inhalation   Inhale 2 puffs into the lungs every 4 (four) hours as needed. Shortness of breath while sick         . etonogestrel (NEXPLANON) 68 MG IMPL implant   Subcutaneous   Inject 1 each into the skin once.         . pseudoephedrine (SUDAFED) 120 MG 12 hr tablet   Oral   Take 1 tablet (120 mg total) by mouth once.   20 tablet   0     BP 123/79  Temp(Src) 98.4 F (36.9 C) (Oral)  Ht 5\' 5"  (1.651 m)  Wt  272 lb (123.378 kg)  BMI 45.26 kg/m2  SpO2 100%  Breastfeeding? No  Physical Exam  Nursing note and vitals reviewed. Constitutional: She is oriented to person, place, and time. She appears well-developed and well-nourished.  HENT:  Head: Normocephalic and atraumatic.  Eyes: Pupils are equal, round, and reactive to light.  Neck: Normal range of motion. Neck supple.  Cardiovascular: Normal rate and normal heart sounds.   Pulmonary/Chest: Effort normal.  Genitourinary: Vagina normal and uterus normal.  Tender left adnexa  Musculoskeletal: Normal range of motion.  Neurological: She is alert and oriented to person, place, and time. She has normal reflexes.  Skin: Skin is warm.    ED Course  Procedures (including critical care time)  Labs Reviewed  WET PREP, GENITAL - Abnormal; Notable for the following:    Clue Cells Wet Prep HPF POC MODERATE (*)    WBC, Wet Prep HPF POC FEW (*)    All other components within normal limits  URINALYSIS,  ROUTINE W REFLEX MICROSCOPIC - Abnormal; Notable for the following:    APPearance CLOUDY (*)    Hgb urine dipstick LARGE (*)    Leukocytes, UA SMALL (*)    All other components within normal limits  URINE MICROSCOPIC-ADD ON - Abnormal; Notable for the following:    Bacteria, UA FEW (*)    All other components within normal limits  URINE CULTURE  GC/CHLAMYDIA PROBE AMP  PREGNANCY, URINE   No results found.   No diagnosis found.    MDM  Pt counseled on ultrasound results,   Pt advised she needs to see her GYN for evaluation of ovarain cyst, adenomyosis and fibroid.   Pt given rx for ibuprofen and hydrocodone.         [name] [place], [place] 09/30/12 1710

## 2012-09-30 NOTE — ED Notes (Signed)
At bedside with PA for discussion with patient  About findings on ultrasound and the need to get in to see her gynecologist pt verbalizes understanding and states is calling someone to come and pick her up as she has been given pain medications.

## 2012-09-30 NOTE — ED Notes (Signed)
Patient states she has had lower abdominal pain for the last 2 days.  States she had implanted birth control in March 2013, States she has had vaginal bleeding for the last month and a half.  Describes pain as sharp in her cervix area, at times the pain lasts up to one hour.

## 2012-10-02 LAB — URINE CULTURE: Colony Count: 80000

## 2012-12-21 IMAGING — CR DG CHEST 2V
2 series · 2 of 2 positions shown · non-contrast
Comparison: 01/10/2011

CLINICAL DATA: Cough

CHEST - 2 VIEW

[w chest pa]
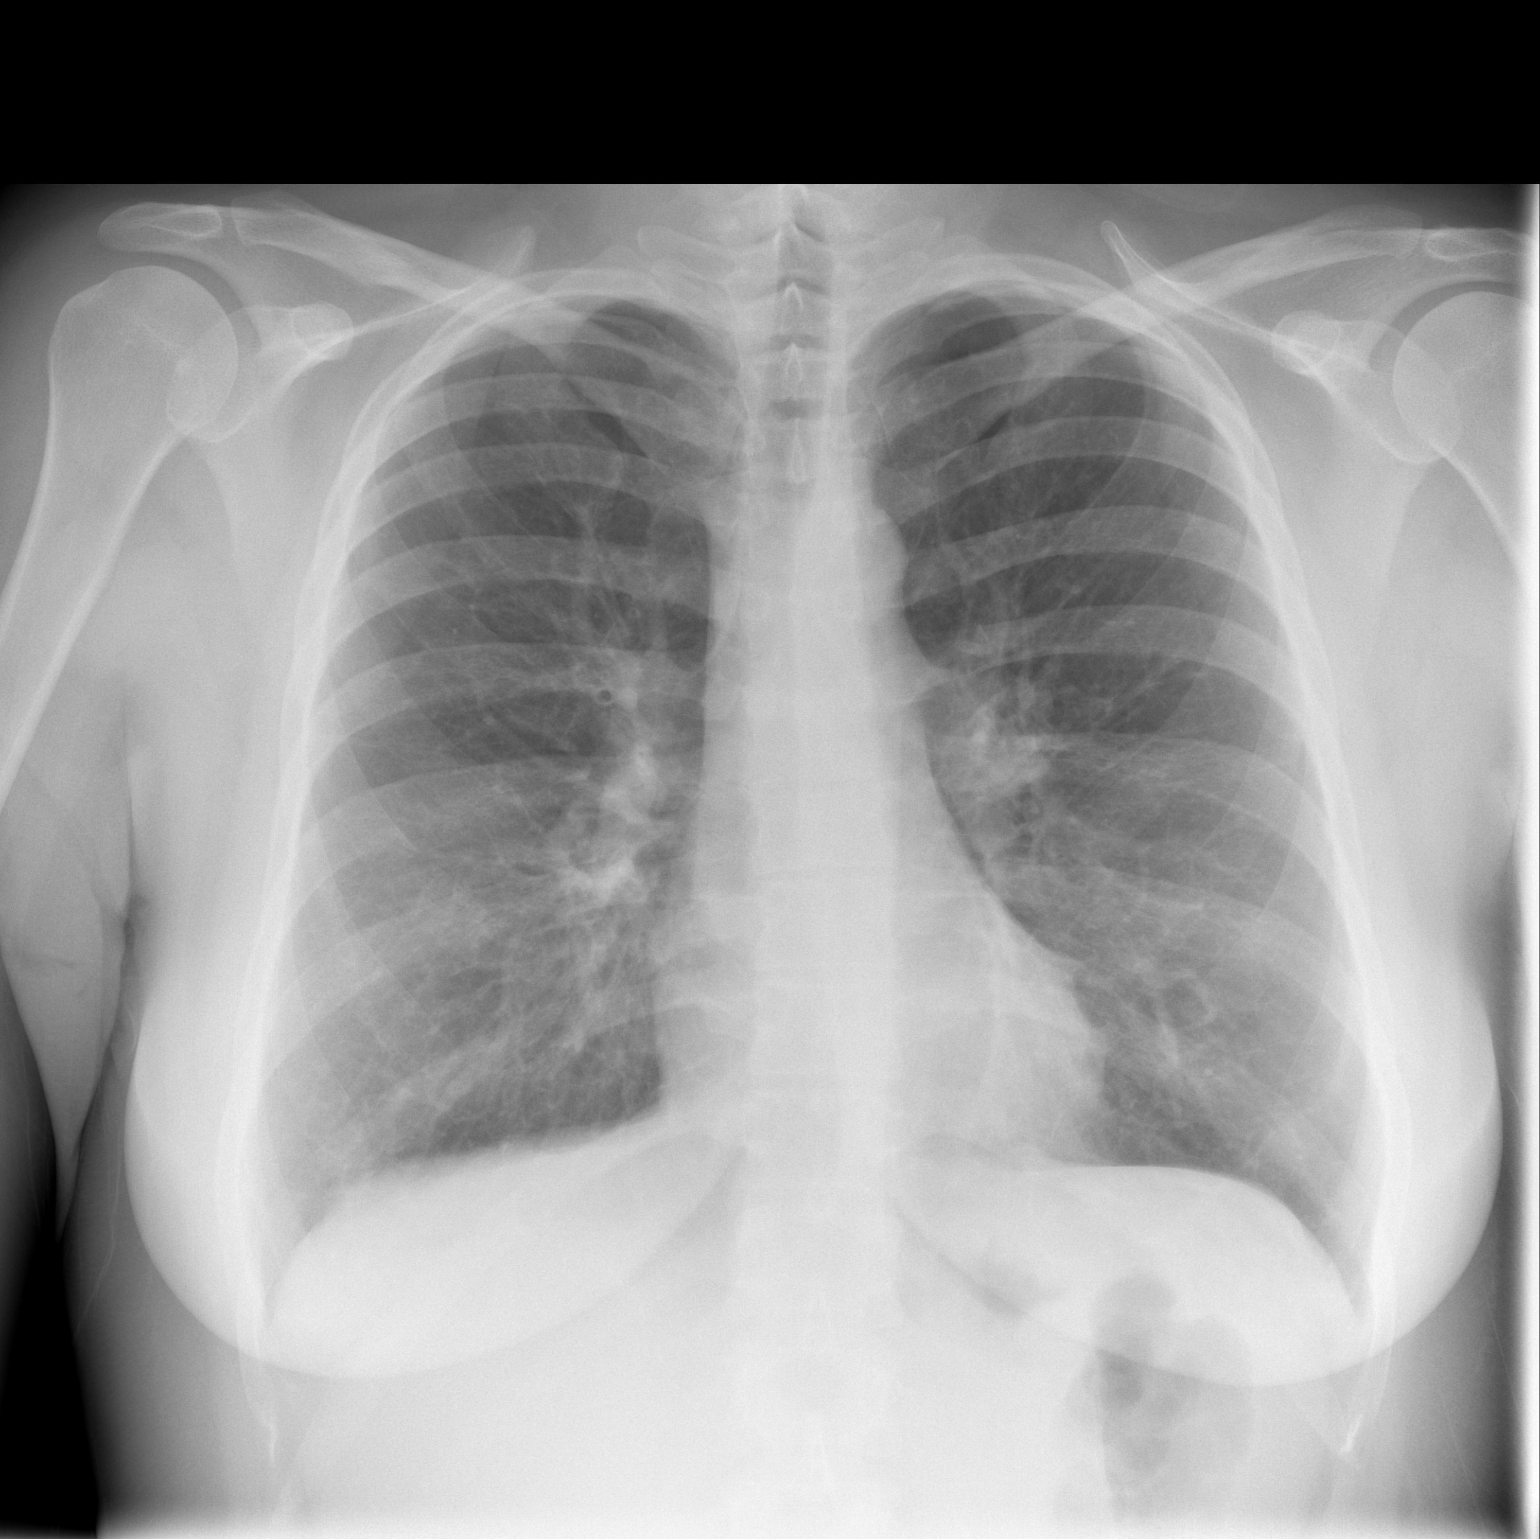

[w chest lat]
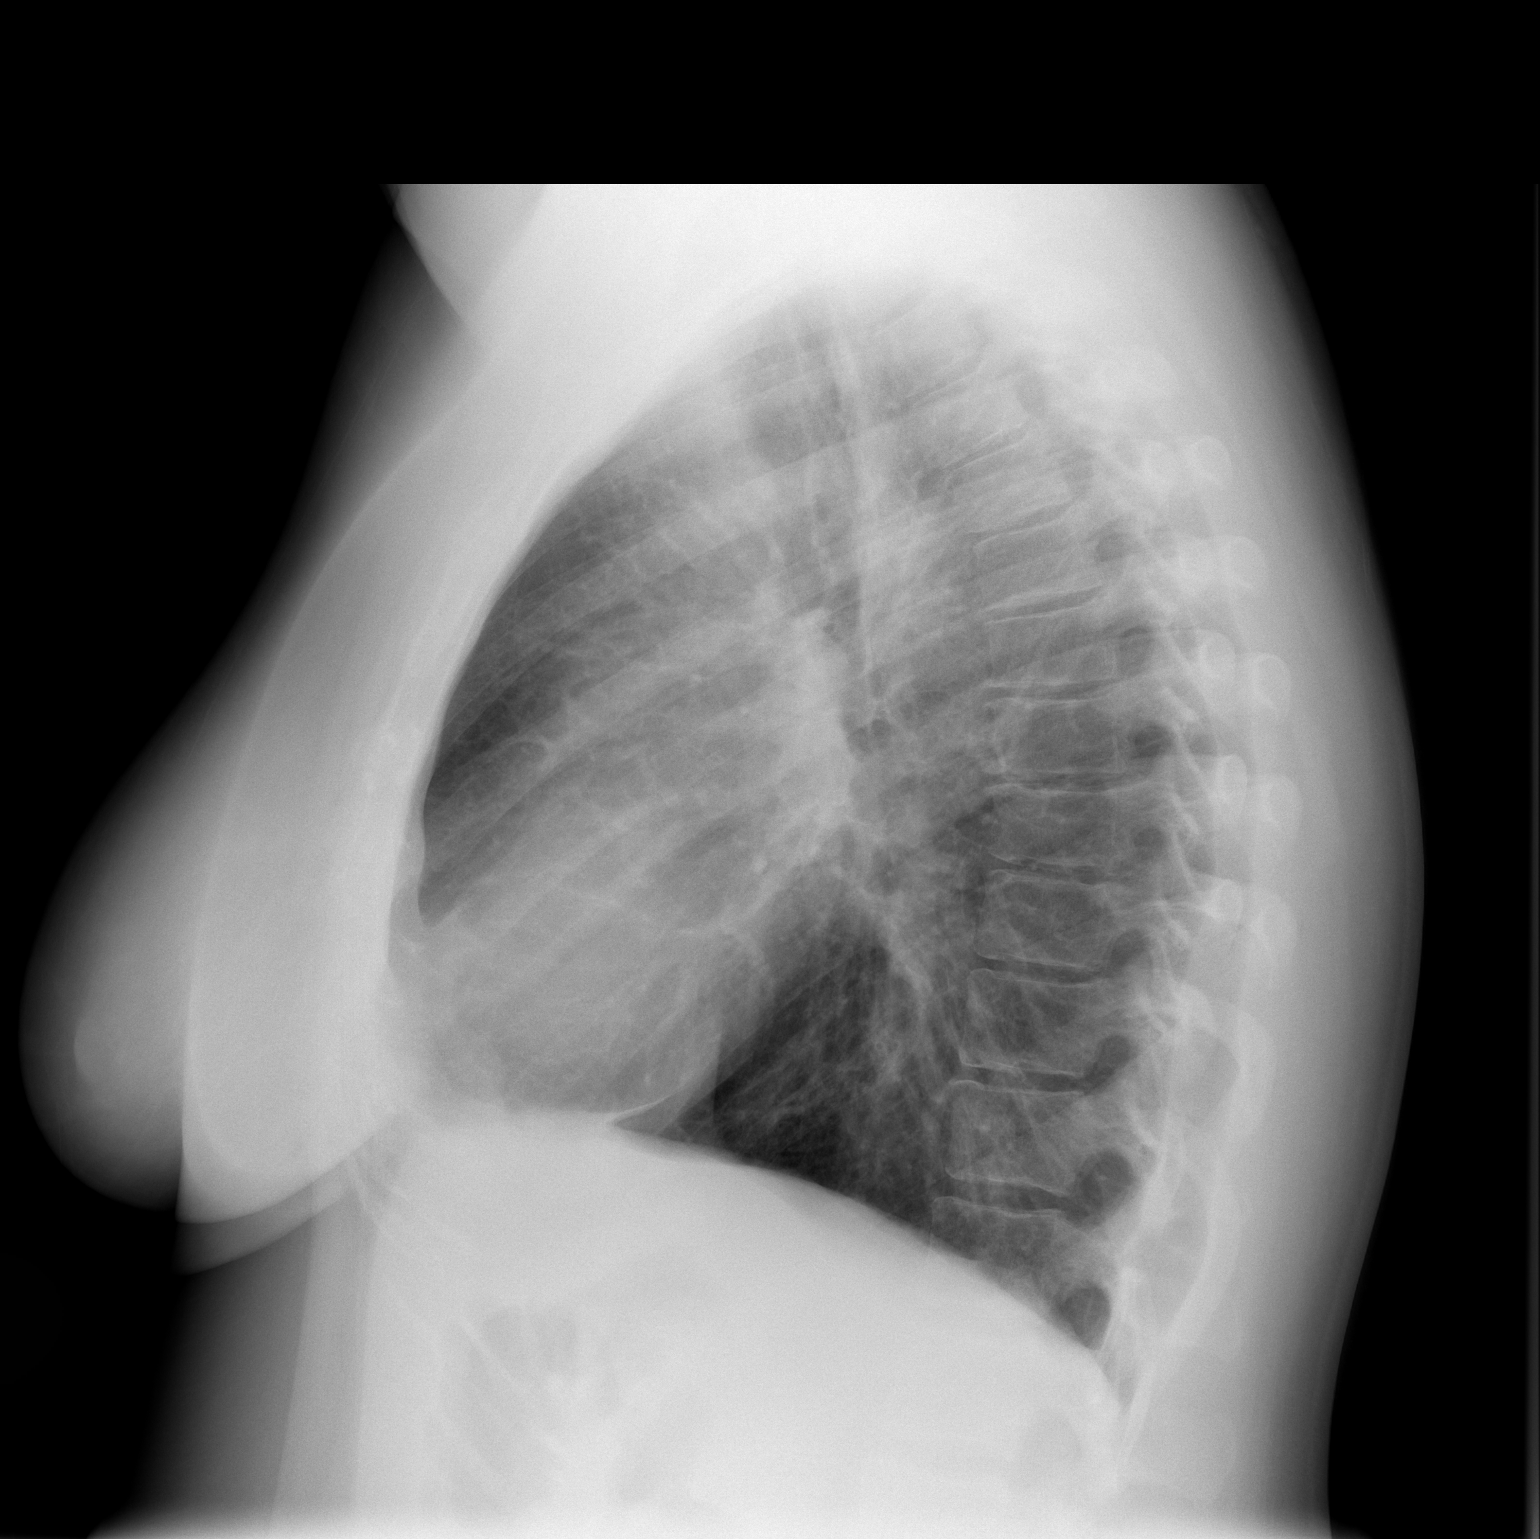

[2 of 2 positions shown; findings below may reference images not displayed]

FINDINGS: Heart size appears normal.  No pleural effusion or
pulmonary edema.

Bronchitic changes are noted bilaterally.  No airspace
consolidation.

Review of the visualized osseous structures is unremarkable.
IMPRESSION: 1.  Bronchitic changes.
2.  No pneumonia.

## 2013-04-21 ENCOUNTER — Inpatient Hospital Stay (HOSPITAL_COMMUNITY)
Admission: AD | Admit: 2013-04-21 | Discharge: 2013-04-21 | Disposition: A | Discharge status: Home / Self Care | Payer: Self-pay | Source: Ambulatory Visit | Attending: Obstetrics and Gynecology | Admitting: Obstetrics and Gynecology

## 2013-04-21 ENCOUNTER — Encounter (HOSPITAL_COMMUNITY): Payer: Self-pay | Admitting: *Deleted

## 2013-04-21 DIAGNOSIS — N912 Amenorrhea, unspecified: Secondary | ICD-10-CM | POA: Insufficient documentation

## 2013-04-21 DIAGNOSIS — Z3202 Encounter for pregnancy test, result negative: Secondary | ICD-10-CM | POA: Insufficient documentation

## 2013-04-21 LAB — URINALYSIS, ROUTINE W REFLEX MICROSCOPIC
Glucose, UA: NEGATIVE mg/dL
Leukocytes, UA: NEGATIVE
Protein, ur: NEGATIVE mg/dL
Urobilinogen, UA: 0.2 mg/dL (ref 0.0–1.0)

## 2013-04-21 LAB — POCT PREGNANCY, URINE: Preg Test, Ur: NEGATIVE

## 2013-04-21 NOTE — MAU Provider Note (Signed)
History     CSN: [id number]  Arrival date and time: 04/21/13 0051   None     No chief complaint on file.  HPI  Casey Price is a 27 y.o. G2P1011 who is here today because she has not had a period since 03/12/13. She states that she has never missed a period since she was 27 years old except for when she was pregnant. She denies any constipation or change in bowel patterns. She denies any abdominal pain. She does not have a GYN.   Past Medical History  Diagnosis Date  . Anxiety   . Abnormal Pap smear   . Depression   . Bipolar disorder schizophrenia  . Bipolar 1 disorder     No meds   . Schizophrenia     no meds   . Asthma   . Epilepsy     "grew out of it, no problem since age 89"  . Headache(784.0)   . Trichomonas     Past Surgical History  Procedure Laterality Date  . Other surgical history      cyst removed from her" tailbone"  . Broken leg      right repair and ankle- no surgical repair  . Incision and drainage of wound      pyelonidal cyst    Family History  Problem Relation Age of Onset  . Hypertension Mother   . Hyperlipidemia Mother   . Anxiety disorder Mother   . Hypertension Father   . Diabetes Father   . COPD Father   . Diabetes Sister   . Anesthesia problems Other     History  Substance Use Topics  . Smoking status: Current Every Day Smoker -- 0.25 packs/day for 13 years    Types: Cigarettes  . Smokeless tobacco: Never Used  . Alcohol Use: Yes    Allergies:  Allergies  Allergen Reactions  . Betadine [Povidone Iodine] Rash  . Contrast Media [Iodinated Diagnostic Agents] Rash  . Darvocet [Propoxyphene-Acetaminophen] Rash  . Iodine Rash  . Shellfish Allergy Swelling and Rash    Prescriptions prior to admission  Medication Sig Dispense Refill  . ibuprofen (ADVIL,MOTRIN) 800 MG tablet Take 1 tablet (800 mg total) by mouth 3 (three) times daily.  21 tablet  0  . albuterol (PROVENTIL HFA;VENTOLIN HFA) 108 (90 BASE) MCG/ACT inhaler  Inhale 2 puffs into the lungs every 4 (four) hours as needed. Shortness of breath while sick      . etonogestrel (NEXPLANON) 68 MG IMPL implant Inject 1 each into the skin once.      Marland Kitchen HYDROcodone-acetaminophen (NORCO/VICODIN) 5-325 MG per tablet Take 2 tablets by mouth every 4 (four) hours as needed for pain.  16 tablet  0  . pseudoephedrine (SUDAFED) 120 MG 12 hr tablet Take 1 tablet (120 mg total) by mouth once.  20 tablet  0    ROS Physical Exam   Blood pressure 141/77, pulse 88, temperature 98.1 F (36.7 C), temperature source Oral, resp. rate 18, height 5\' 4"  (1.626 m), weight 122.471 kg (270 lb), last menstrual period 03/12/2013, not currently breastfeeding.  Physical Exam  Nursing note and vitals reviewed. Constitutional: She is oriented to person, place, and time. She appears well-developed and well-nourished. No distress.  Cardiovascular: Normal rate.   Respiratory: Effort normal.  GI: Soft.  Neurological: She is alert and oriented to person, place, and time.  Skin: Skin is warm and dry.  Psychiatric: She has a normal mood and affect.    MAU  Course  Procedures  Results for orders placed during the hospital encounter of 04/21/13 (from the past 24 hour(s))  POCT PREGNANCY, URINE     Status: None   Collection Time    04/21/13  1:12 AM      Result Value Range   Preg Test, Ur NEGATIVE  NEGATIVE    Assessment and Plan   1. Encounter for pregnancy test with result negative    Discussed possible causes of amenorrhea Will keep menstrual calendar FU in GYN clinic   Casey Price 04/21/2013, 1:32 AM

## 2013-04-21 NOTE — MAU Provider Note (Signed)
Attestation of Attending Supervision of Advanced Practitioner (CNM/NP): Evaluation and management procedures were performed by the Advanced Practitioner under my supervision and collaboration.  I have reviewed the Advanced Practitioner's note and chart, and I agree with the management and plan.  Kayden Hutmacher 04/21/2013 8:08 AM   

## 2013-04-21 NOTE — MAU Note (Signed)
Patient reports she has not had a menstrual period since July. 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

## 2013-04-28 ENCOUNTER — Encounter: Payer: Self-pay | Admitting: Obstetrics and Gynecology

## 2013-08-15 ENCOUNTER — Encounter (HOSPITAL_BASED_OUTPATIENT_CLINIC_OR_DEPARTMENT_OTHER): Payer: Self-pay | Admitting: Emergency Medicine

## 2013-08-15 ENCOUNTER — Emergency Department (HOSPITAL_BASED_OUTPATIENT_CLINIC_OR_DEPARTMENT_OTHER)
Admission: EM | Admit: 2013-08-15 | Discharge: 2013-08-15 | Disposition: A | Discharge status: Home / Self Care | Payer: BC Managed Care – PPO | Attending: Emergency Medicine | Admitting: Emergency Medicine

## 2013-08-15 DIAGNOSIS — M79609 Pain in unspecified limb: Secondary | ICD-10-CM | POA: Insufficient documentation

## 2013-08-15 DIAGNOSIS — Z8659 Personal history of other mental and behavioral disorders: Secondary | ICD-10-CM | POA: Insufficient documentation

## 2013-08-15 DIAGNOSIS — J40 Bronchitis, not specified as acute or chronic: Secondary | ICD-10-CM

## 2013-08-15 DIAGNOSIS — J45901 Unspecified asthma with (acute) exacerbation: Secondary | ICD-10-CM | POA: Insufficient documentation

## 2013-08-15 DIAGNOSIS — Z79899 Other long term (current) drug therapy: Secondary | ICD-10-CM | POA: Insufficient documentation

## 2013-08-15 DIAGNOSIS — Z791 Long term (current) use of non-steroidal anti-inflammatories (NSAID): Secondary | ICD-10-CM | POA: Insufficient documentation

## 2013-08-15 DIAGNOSIS — H9209 Otalgia, unspecified ear: Secondary | ICD-10-CM | POA: Insufficient documentation

## 2013-08-15 DIAGNOSIS — F17200 Nicotine dependence, unspecified, uncomplicated: Secondary | ICD-10-CM | POA: Insufficient documentation

## 2013-08-15 DIAGNOSIS — R6883 Chills (without fever): Secondary | ICD-10-CM | POA: Insufficient documentation

## 2013-08-15 DIAGNOSIS — M25559 Pain in unspecified hip: Secondary | ICD-10-CM | POA: Insufficient documentation

## 2013-08-15 DIAGNOSIS — J3489 Other specified disorders of nose and nasal sinuses: Secondary | ICD-10-CM | POA: Insufficient documentation

## 2013-08-15 MED ORDER — PHENYLEPH-PROMETHAZINE-COD 5-6.25-10 MG/5ML PO SYRP: 5.0000 mL | ORAL_SOLUTION | ORAL | Status: AC | PRN

## 2013-08-15 MED ORDER — ALBUTEROL SULFATE HFA 108 (90 BASE) MCG/ACT IN AERS: 2.0000 | INHALATION_SPRAY | RESPIRATORY_TRACT | Status: AC | PRN

## 2013-08-15 MED ORDER — AZITHROMYCIN 250 MG PO TABS: 250.0000 mg | ORAL_TABLET | Freq: Every day | ORAL | Status: AC

## 2013-08-15 MED ORDER — IPRATROPIUM BROMIDE 0.02 % IN SOLN
0.5000 mg | Freq: Once | RESPIRATORY_TRACT | Status: AC
  Administered 2013-08-15: 0.5 mg via RESPIRATORY_TRACT
  Filled 2013-08-15: qty 2.5

## 2013-08-15 MED ORDER — ALBUTEROL SULFATE (5 MG/ML) 0.5% IN NEBU
5.0000 mg | INHALATION_SOLUTION | Freq: Once | RESPIRATORY_TRACT | Status: AC
  Administered 2013-08-15: 5 mg via RESPIRATORY_TRACT
  Filled 2013-08-15: qty 1

## 2013-08-15 NOTE — ED Notes (Signed)
C/o cough x 3 days without fever. Also reports bilateral hip pain. Denies injury or trauma

## 2013-08-15 NOTE — ED Provider Notes (Signed)
CSN: [id number]     Arrival date & time 08/15/13  1511 History   First MD Initiated Contact with Patient 08/15/13 1733     Chief Complaint  Patient presents with  . Cough  . Hip Pain   (Consider location/radiation/quality/duration/timing/severity/associated sxs/prior Treatment) Patient is a 27 y.o. female presenting with cough and hip pain. The history is provided by the patient.  Cough Cough characteristics:  Productive Sputum characteristics:  Yellow and green Severity:  Moderate Onset quality:  Gradual Duration:  3 days Timing:  Constant Progression:  Worsening Chronicity:  New Smoker: yes   Relieved by:  Nothing (used daughter's home neb and it helped) Worsened by:  Activity, lying down and smoking Associated symptoms: chills, ear pain, myalgias, rhinorrhea, shortness of breath, sinus congestion, sore throat and wheezing   Associated symptoms: no eye discharge, no fever, no headaches and no rash   Hip Pain Associated symptoms include chills, coughing, myalgias and a sore throat. Pertinent negatives include no abdominal pain, fever, headaches, nausea, rash or vomiting.   Casey Price is a 27 y.o. female who presents to the ED with cough cold and congestion that started 3 days ago. She complains of aching all over. Her hips, legs and arms ache. She used used her daughters neb treatment at home and it helped. She had a treatment here and it helped more. She does smoke cigarettes every day.  Past Medical History  Diagnosis Date  . Anxiety   . Abnormal Pap smear   . Depression   . Bipolar disorder schizophrenia  . Bipolar 1 disorder     No meds   . Schizophrenia     no meds   . Asthma   . Headache(784.0)   . Trichomonas   . Epilepsy     "last seizure age 27"   Past Surgical History  Procedure Laterality Date  . Other surgical history      cyst removed from her" tailbone"  . Incision and drainage of wound      pyelonidal cyst   Family History  Problem  Relation Age of Onset  . Hypertension Mother   . Hyperlipidemia Mother   . Anxiety disorder Mother   . Hypertension Father   . Diabetes Father   . COPD Father   . Diabetes Sister   . Anesthesia problems Other    History  Substance Use Topics  . Smoking status: Current Every Day Smoker -- 0.25 packs/day for 13 years    Types: Cigarettes  . Smokeless tobacco: Never Used  . Alcohol Use: No   OB History   Grav Para Term Preterm Abortions TAB SAB Ect Mult Living   2 1 1  1  1   1      Review of Systems  Constitutional: Positive for chills. Negative for fever.  HENT: Positive for ear pain, rhinorrhea and sore throat. Negative for trouble swallowing.   Eyes: Negative for discharge, redness and itching.  Respiratory: Positive for cough, shortness of breath and wheezing. Chest tightness: with cough.   Gastrointestinal: Negative for nausea, vomiting and abdominal pain.  Genitourinary: Negative for dysuria, urgency and frequency.  Musculoskeletal: Positive for myalgias.  Skin: Negative for rash.  Neurological: Negative for light-headedness and headaches.  Psychiatric/Behavioral: Negative for confusion. The patient is not nervous/anxious.     Allergies  Betadine; Contrast media; Darvocet; Iodine; and Shellfish allergy  Home Medications   Current Outpatient Rx  Name  Route  Sig  Dispense  Refill  . EXPIRED:  albuterol (PROVENTIL HFA;VENTOLIN HFA) 108 (90 BASE) MCG/ACT inhaler   Inhalation   Inhale 2 puffs into the lungs every 4 (four) hours as needed. Shortness of breath while sick         . HYDROcodone-acetaminophen (NORCO/VICODIN) 5-325 MG per tablet   Oral   Take 2 tablets by mouth every 4 (four) hours as needed for pain.   16 tablet   0   . ibuprofen (ADVIL,MOTRIN) 800 MG tablet   Oral   Take 1 tablet (800 mg total) by mouth 3 (three) times daily.   21 tablet   0   . pseudoephedrine (SUDAFED) 120 MG 12 hr tablet   Oral   Take 1 tablet (120 mg total) by mouth once.    20 tablet   0    BP 122/71  Pulse 88  Temp(Src) 98.1 F (36.7 C) (Oral)  Resp 20  Ht 5\' 4"  (1.626 m)  Wt 272 lb (123.378 kg)  BMI 46.67 kg/m2  SpO2 96%  LMP 07/27/2013  Breastfeeding? No Physical Exam  Nursing note and vitals reviewed. Constitutional: She is oriented to person, place, and time. She appears well-developed and well-nourished. No distress.  HENT:  Head: Normocephalic and atraumatic.  Right Ear: External ear normal.  Left Ear: External ear normal.  Nose: Mucosal edema and rhinorrhea present.  Mouth/Throat: Uvula is midline and mucous membranes are normal. Posterior oropharyngeal erythema present.  Eyes: Conjunctivae and EOM are normal. Pupils are equal, round, and reactive to light.  Neck: Normal range of motion. Neck supple.  Cardiovascular: Normal rate, regular rhythm and normal heart sounds.   Pulmonary/Chest: Effort normal. She has wheezes. She has rhonchi.  Abdominal: Soft. Bowel sounds are normal. There is no tenderness.  Musculoskeletal: Normal range of motion.  Neurological: She is alert and oriented to person, place, and time. No cranial nerve deficit.  Skin: Skin is warm and dry.  Psychiatric: She has a normal mood and affect. Her behavior is normal.    ED Course  Procedures    MDM  27 y.o. female with flu like symptoms x 3 days. Cough and wheezing improved with albuterol neb. Will treat symptoms of bronchitis. She is stable for discharge without further screening at this time. She will return for worsening symptoms.  Discussed with the patient clinical findings and plan of care. All questioned fully answered. Encouraged patient to stop smoking.     Medication List    TAKE these medications       albuterol 108 (90 BASE) MCG/ACT inhaler  Commonly known as:  PROVENTIL HFA;VENTOLIN HFA  Inhale 2 puffs into the lungs every 4 (four) hours as needed for wheezing or shortness of breath.     azithromycin 250 MG tablet  Commonly known as:  ZITHROMAX   Take 1 tablet (250 mg total) by mouth daily. Take first 2 tablets together, then 1 every day until finished.     Phenyleph-Promethazine-Cod 5-6.25-10 MG/5ML Syrp  Take 5 mLs by mouth every 4 (four) hours as needed.      ASK your doctor about these medications       HYDROcodone-acetaminophen 5-325 MG per tablet  Commonly known as:  NORCO/VICODIN  Take 2 tablets by mouth every 4 (four) hours as needed for pain.     ibuprofen 800 MG tablet  Commonly known as:  ADVIL,MOTRIN  Take 1 tablet (800 mg total) by mouth 3 (three) times daily.     pseudoephedrine 120 MG 12 hr tablet  Commonly known as:  SUDAFED  Take 1 tablet (120 mg total) by mouth once.         Janne Napoleon, [place] 08/16/13 [phone]

## 2013-08-24 NOTE — ED Provider Notes (Signed)
Medical screening examination/treatment/procedure(s) were performed by non-physician practitioner and as supervising physician I was immediately available for consultation/collaboration.  EKG Interpretation   None         Saddie Benders. [place] Vierling, MD 08/24/13 1526

## 2013-10-14 ENCOUNTER — Encounter (HOSPITAL_BASED_OUTPATIENT_CLINIC_OR_DEPARTMENT_OTHER): Payer: Self-pay | Admitting: Emergency Medicine

## 2013-10-14 ENCOUNTER — Emergency Department (HOSPITAL_BASED_OUTPATIENT_CLINIC_OR_DEPARTMENT_OTHER)
Admission: EM | Admit: 2013-10-14 | Discharge: 2013-10-14 | Disposition: A | Discharge status: Home / Self Care | Payer: BC Managed Care – PPO | Attending: Emergency Medicine | Admitting: Emergency Medicine

## 2013-10-14 DIAGNOSIS — F17200 Nicotine dependence, unspecified, uncomplicated: Secondary | ICD-10-CM | POA: Insufficient documentation

## 2013-10-14 DIAGNOSIS — R062 Wheezing: Secondary | ICD-10-CM

## 2013-10-14 DIAGNOSIS — Z8619 Personal history of other infectious and parasitic diseases: Secondary | ICD-10-CM | POA: Insufficient documentation

## 2013-10-14 DIAGNOSIS — J45901 Unspecified asthma with (acute) exacerbation: Secondary | ICD-10-CM | POA: Insufficient documentation

## 2013-10-14 DIAGNOSIS — Z8669 Personal history of other diseases of the nervous system and sense organs: Secondary | ICD-10-CM | POA: Insufficient documentation

## 2013-10-14 DIAGNOSIS — Z791 Long term (current) use of non-steroidal anti-inflammatories (NSAID): Secondary | ICD-10-CM | POA: Insufficient documentation

## 2013-10-14 DIAGNOSIS — Z8659 Personal history of other mental and behavioral disorders: Secondary | ICD-10-CM | POA: Insufficient documentation

## 2013-10-14 DIAGNOSIS — J329 Chronic sinusitis, unspecified: Secondary | ICD-10-CM | POA: Insufficient documentation

## 2013-10-14 DIAGNOSIS — H9209 Otalgia, unspecified ear: Secondary | ICD-10-CM | POA: Insufficient documentation

## 2013-10-14 DIAGNOSIS — Z79899 Other long term (current) drug therapy: Secondary | ICD-10-CM | POA: Insufficient documentation

## 2013-10-14 MED ORDER — AMOXICILLIN 500 MG PO CAPS: 500.0000 mg | ORAL_CAPSULE | Freq: Three times a day (TID) | ORAL | Status: AC

## 2013-10-14 MED ORDER — ALBUTEROL SULFATE HFA 108 (90 BASE) MCG/ACT IN AERS
2.0000 | INHALATION_SPRAY | RESPIRATORY_TRACT | Status: DC | PRN
  Administered 2013-10-14: 2 via RESPIRATORY_TRACT
  Filled 2013-10-14: qty 6.7

## 2013-10-14 MED ORDER — IBUPROFEN 800 MG PO TABS: 800.0000 mg | ORAL_TABLET | Freq: Once | ORAL | Status: DC

## 2013-10-14 MED ORDER — PSEUDOEPHEDRINE HCL ER 120 MG PO TB12: 120.0000 mg | ORAL_TABLET | Freq: Once | ORAL | Status: AC

## 2013-10-14 NOTE — ED Provider Notes (Signed)
Medical screening examination/treatment/procedure(s) were performed by non-physician practitioner and as supervising physician I was immediately available for consultation/collaboration.    Dot Lanes, MD 10/14/13 2051

## 2013-10-14 NOTE — ED Notes (Signed)
C/o sinus congestion, left ear ache x 2 days

## 2013-10-14 NOTE — Discharge Instructions (Signed)
Sinus Headache °A sinus headache happens when your sinuses become clogged or puffy (swollen). Sinus headaches can be mild or severe. °HOME CARE °· Take your medicines (antibiotics) as told. Finish them even if you start to feel better. °· Only take medicine as told by your doctor. °· Use a nose spray if you feel stuffed up (congested). °GET HELP RIGHT AWAY IF: °· You have a fever. °· You have trouble seeing. °· You suddenly have pain in your face or head. °· You start to twitch or shake (seizure). °· You are confused. °· You get headaches more than once a week. °· Light or sound bothers you. °· You feel sick to your stomach (nauseous) or throw up (vomit). °· Your headaches do not get better with treatment. °MAKE SURE YOU: °· Understand these instructions. °· Will watch your condition. °· Will get help right away if you are not doing well or get worse. °Document Released: 12/06/2010 Document Revised: 10/29/2011 Document Reviewed: 12/06/2010 °[employer]® Patient Information ©2014 [employer]. ° °

## 2013-10-14 NOTE — ED Provider Notes (Signed)
CSN: [id number]     Arrival date & time 10/14/13  2019 History   First MD Initiated Contact with Patient 10/14/13 2031     Chief Complaint  Patient presents with  . URI     (Consider location/radiation/quality/duration/timing/severity/associated sxs/prior Treatment) Patient is a 28 y.o. female presenting with URI. The history is provided by the patient. No language interpreter was used.  URI Presenting symptoms: congestion, cough and ear pain   Presenting symptoms: no fever   Severity:  Moderate Onset quality:  Gradual Duration:  2 days Timing:  Constant Progression:  Unchanged Chronicity:  New Relieved by:  Nothing Ineffective treatments:  OTC medications Associated symptoms: sinus pain     Past Medical History  Diagnosis Date  . Anxiety   . Abnormal Pap smear   . Depression   . Bipolar disorder schizophrenia  . Bipolar 1 disorder     No meds   . Schizophrenia     no meds   . Asthma   . Headache(784.0)   . Trichomonas   . Epilepsy     "last seizure age 44"   Past Surgical History  Procedure Laterality Date  . Other surgical history      cyst removed from her" tailbone"  . Incision and drainage of wound      pyelonidal cyst   Family History  Problem Relation Age of Onset  . Hypertension Mother   . Hyperlipidemia Mother   . Anxiety disorder Mother   . Hypertension Father   . Diabetes Father   . COPD Father   . Diabetes Sister   . Anesthesia problems Other    History  Substance Use Topics  . Smoking status: Current Every Day Smoker -- 0.25 packs/day for 13 years    Types: Cigarettes  . Smokeless tobacco: Never Used  . Alcohol Use: No   OB History   Grav Para Term Preterm Abortions TAB SAB Ect Mult Living   2 1 1  1  1   1      Review of Systems  Constitutional: Negative for fever.  HENT: Positive for congestion and ear pain.   Respiratory: Positive for cough.   Cardiovascular: Negative.       Allergies  Betadine; Contrast media; Darvocet;  Iodine; and Shellfish allergy  Home Medications   Current Outpatient Rx  Name  Route  Sig  Dispense  Refill  . albuterol (PROVENTIL HFA;VENTOLIN HFA) 108 (90 BASE) MCG/ACT inhaler   Inhalation   Inhale 2 puffs into the lungs every 4 (four) hours as needed for wheezing or shortness of breath.   1 Inhaler   0   . azithromycin (ZITHROMAX) 250 MG tablet   Oral   Take 1 tablet (250 mg total) by mouth daily. Take first 2 tablets together, then 1 every day until finished.   6 tablet   0   . HYDROcodone-acetaminophen (NORCO/VICODIN) 5-325 MG per tablet   Oral   Take 2 tablets by mouth every 4 (four) hours as needed for pain.   16 tablet   0   . ibuprofen (ADVIL,MOTRIN) 800 MG tablet   Oral   Take 1 tablet (800 mg total) by mouth 3 (three) times daily.   21 tablet   0   . Phenyleph-Promethazine-Cod 5-6.25-10 MG/5ML SYRP   Oral   Take 5 mLs by mouth every 4 (four) hours as needed.   120 mL   0   . pseudoephedrine (SUDAFED) 120 MG 12 hr tablet   Oral  Take 1 tablet (120 mg total) by mouth once.   20 tablet   0    BP 153/95  Pulse 96  Temp(Src) 98.4 F (36.9 C) (Oral)  Resp 22  Ht 5\' 4"  (1.626 m)  Wt 272 lb (123.378 kg)  BMI 46.67 kg/m2  SpO2 100%  LMP 10/14/2013 Physical Exam  Nursing note and vitals reviewed. Constitutional: She appears well-developed and well-nourished.  HENT:  Head: Normocephalic and atraumatic.  Right Ear: External ear normal.  Left Ear: External ear normal.  Nose: Mucosal edema present. Left sinus exhibits frontal sinus tenderness.  Eyes: EOM are normal.  Cardiovascular: Normal rate and regular rhythm.   Pulmonary/Chest: Effort normal. She has wheezes.  Musculoskeletal: Normal range of motion.  Neurological: She is alert.  Skin: Skin is warm and dry.  Psychiatric: She has a normal mood and affect.    ED Course  Procedures (including critical care time) Labs Review Labs Reviewed - No data to display Imaging Review No results  found.  EKG Interpretation   None       MDM   Final diagnoses:  Sinusitis  Wheezing    Pt given an inhaler here:will treat for sinusitis    Glendell Docker, NP 10/14/13 2048

## 2013-10-28 IMAGING — US US TRANSVAGINAL NON-OB
1 series · 13 of 25 positions shown · non-contrast
Comparison: .

CLINICAL DATA: Pelvic pain and vaginal bleeding.  The patient is 26
years old. Implanon contraception.

TRANSABDOMINAL AND TRANSVAGINAL ULTRASOUND OF PELVIS
TECHNIQUE: Both transabdominal and transvaginal ultrasound
examinations of the pelvis were performed. Transabdominal technique
was performed for global imaging of the pelvis including uterus,
ovaries, adnexal regions, and pelvic cul-de-sac.
It was necessary to proceed with endovaginal exam following the
transabdominal exam to visualize the uterus, endometrium, ovaries,
and adnexa.

[Series 1: us transvaginal non-ob · 0.30mm/px · 13 of 63 slices shown]
[im 1/63]
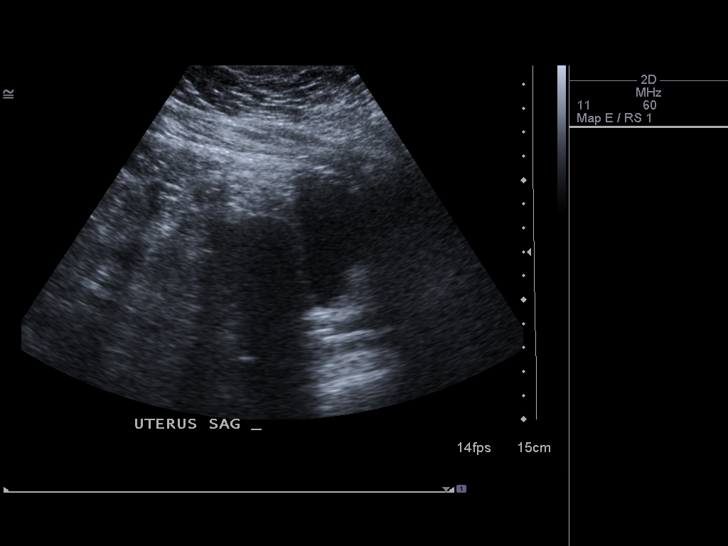
[im 6/63]
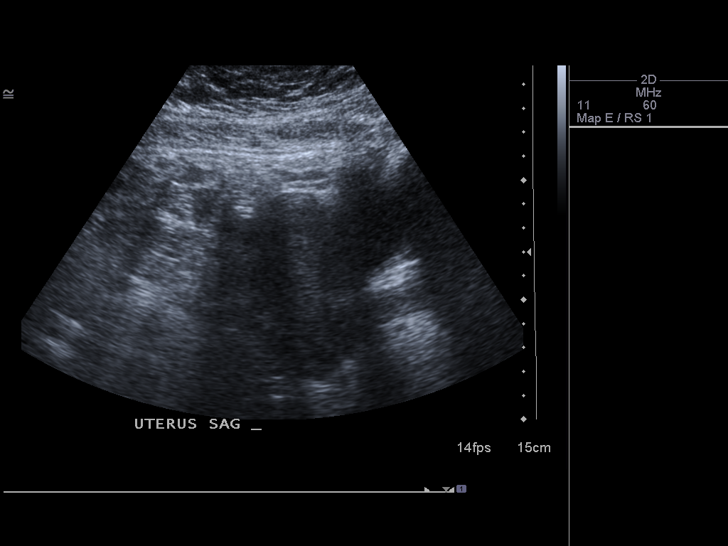
[im 11/63]
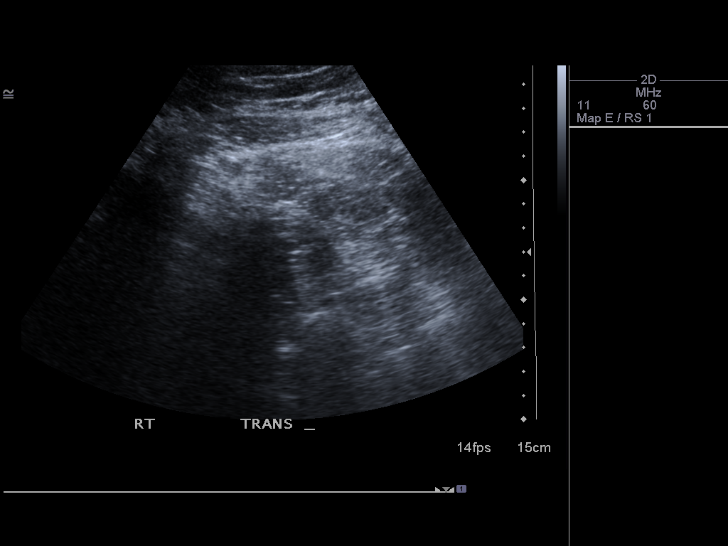
[im 16/63]
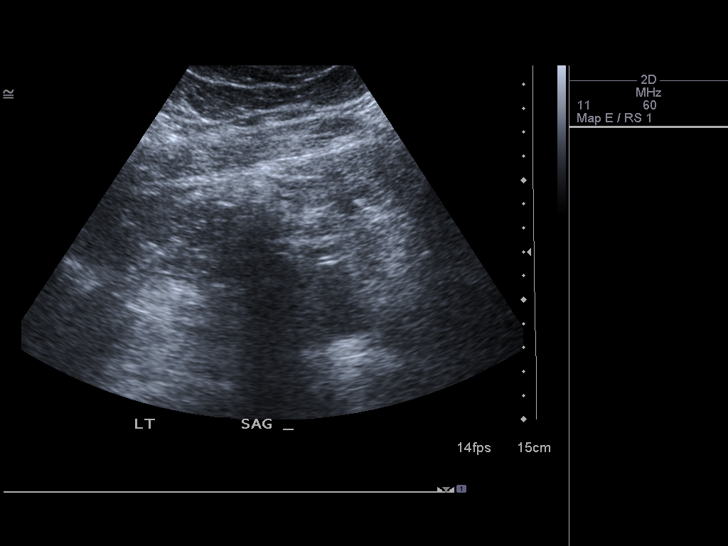
[im 21/63]
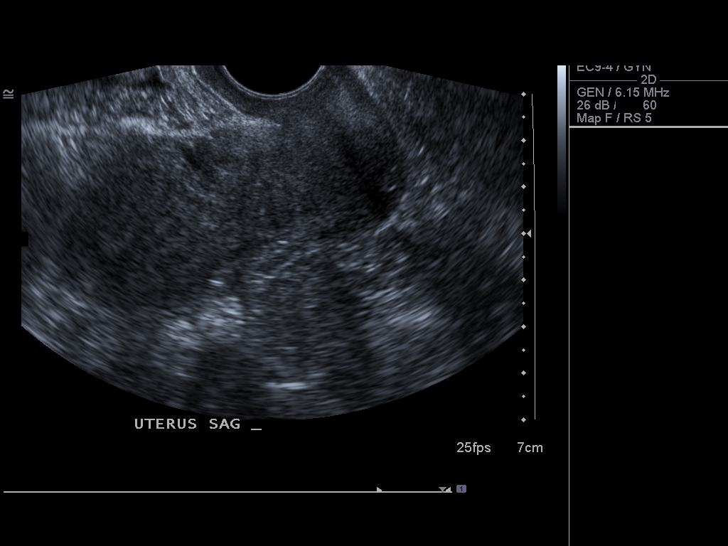
[im 26/63]
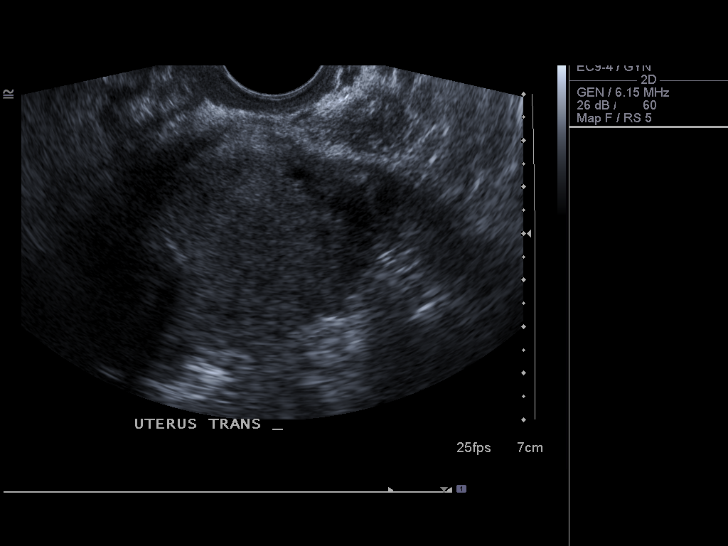
[im 32/63]
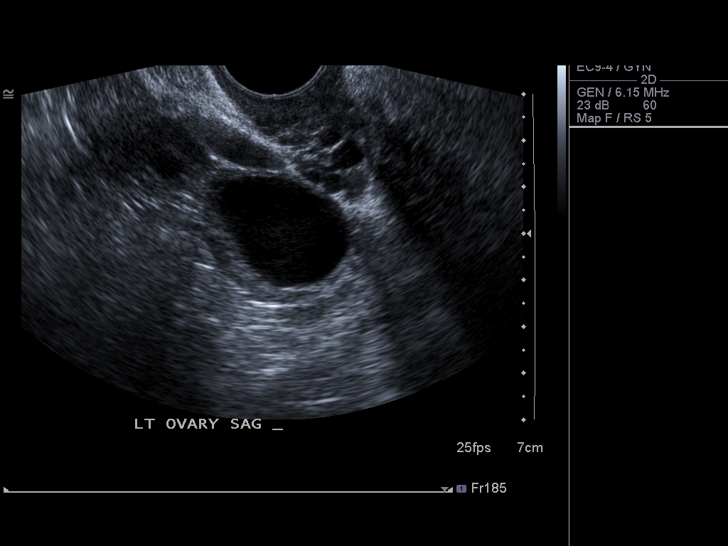
[im 37/63]
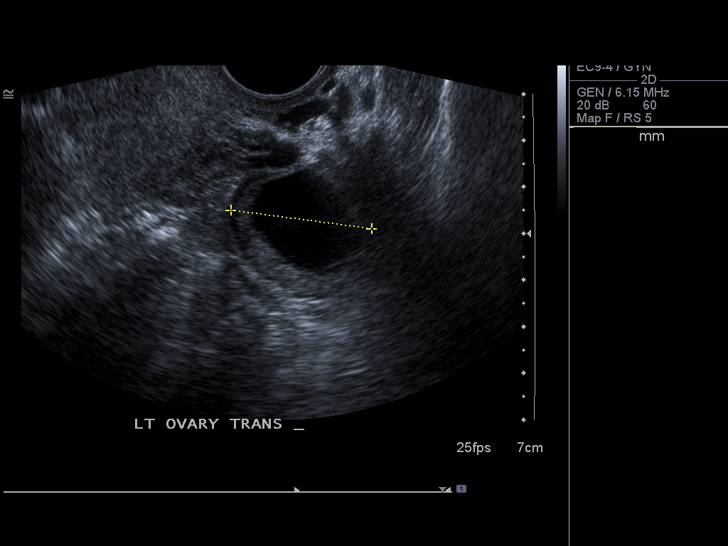
[im 42/63]
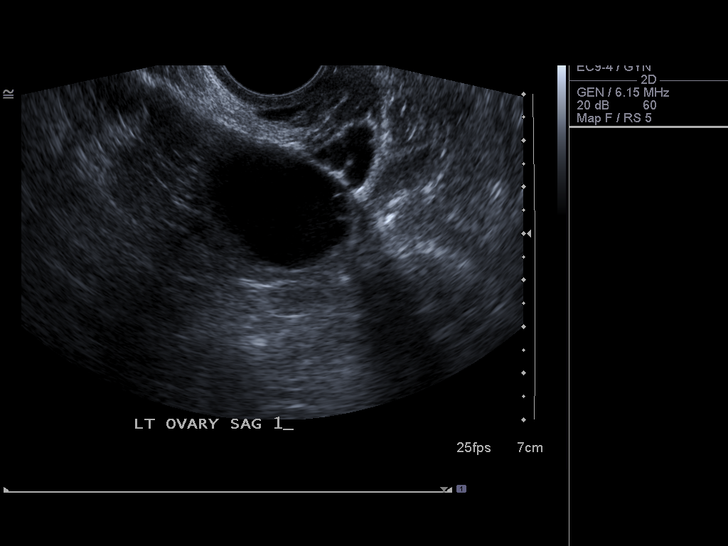
[im 47/63]
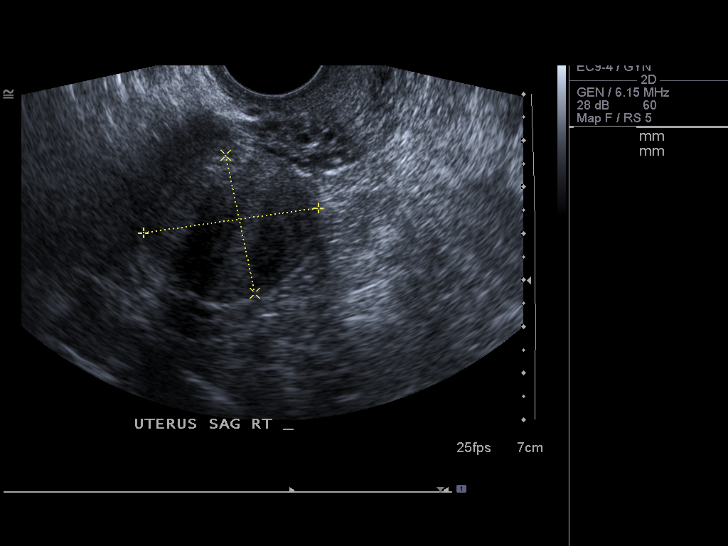
[im 52/63]
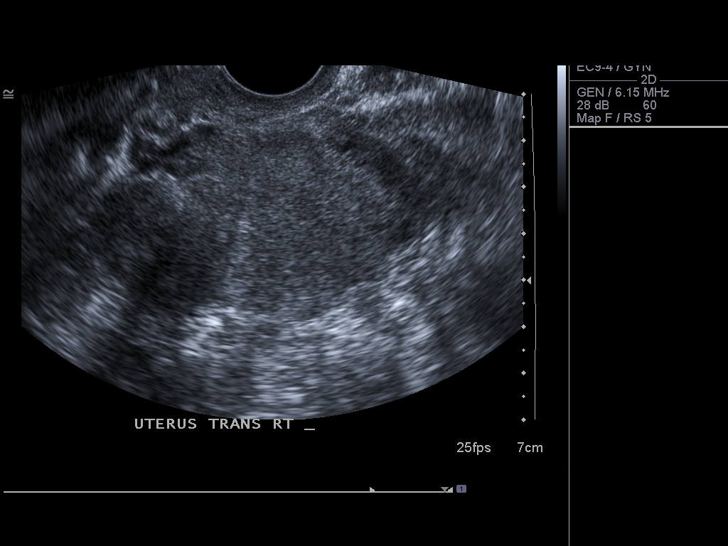
[im 57/63]
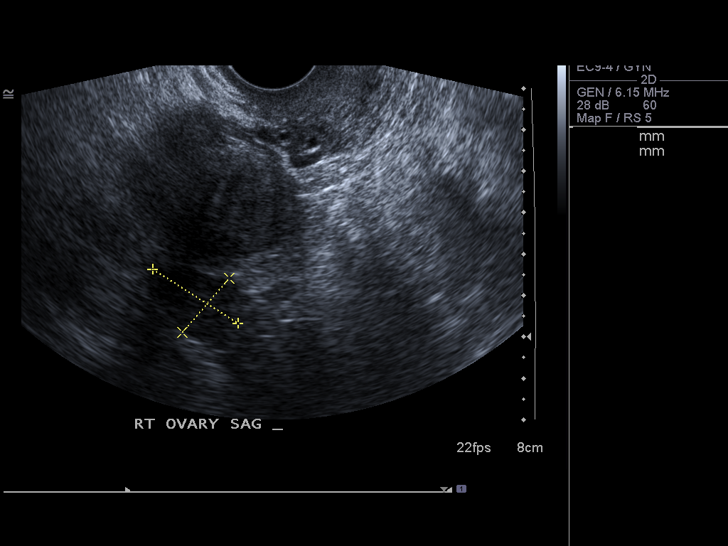
[im 63/63]
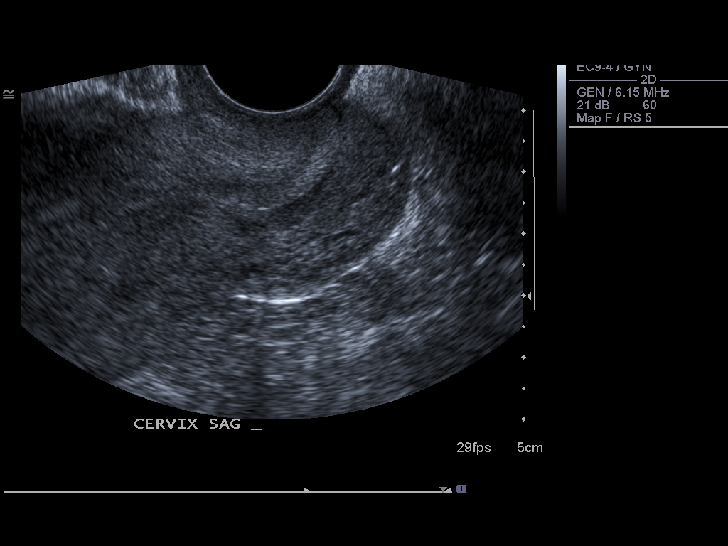

[13 of 25 positions shown; findings below may reference images not displayed]

FINDINGS: Uterus: Measures 8.3 x 4.5 x 5.4 cm and is anteverted.  A
subserosal hypoechoic mass with associated linear shadowing extends
from the posterior aspect of the right uterine body, consistent
with a uterine fibroid.

Uterine echotexture is heterogeneous throughout, with suggestion of
linear areas of shadowing. The junctional zone between the
endometrium and myometrium is indistinct. These findings suggest
uterine adenomyosis.

Endometrium: Measures approximately 7 mm in thickness.  No  focal
endometrial abnormality identified.

Right ovary:  Normal appearance/no adnexal mass.  Measures 2.4 x
1.7 x 1.8 cm.

Left ovary: Measures 3.5 x 2.8 x 3.1 cm and contains a 3.3 x 2.4 x
2.3 cm simple cyst.

Other findings: No free fluid.
IMPRESSION: 1.  3.3 cm simple cyst left ovary. This is almost certainly benign,
and no specific imaging follow up is recommended according to the
Society of Radiologists in Ultrasound 5474 Consensus  Conference
Statement (Velhinho Lyma et al. Management of Asymptomatic Ovarian and
Other Adnexal Cysts Imaged at US:  Society of Radiologists in
(April 2009): [phone].).
2.  3.8 cm subserosal posterior right uterine body fibroid.
3.  Suspect uterine adenomyosis.

## 2013-11-01 ENCOUNTER — Emergency Department (HOSPITAL_BASED_OUTPATIENT_CLINIC_OR_DEPARTMENT_OTHER)
Admission: EM | Admit: 2013-11-01 | Discharge: 2013-11-01 | Disposition: A | Discharge status: Home / Self Care | Payer: BC Managed Care – PPO | Attending: Emergency Medicine | Admitting: Emergency Medicine

## 2013-11-01 ENCOUNTER — Emergency Department (HOSPITAL_BASED_OUTPATIENT_CLINIC_OR_DEPARTMENT_OTHER): Payer: BC Managed Care – PPO

## 2013-11-01 ENCOUNTER — Encounter (HOSPITAL_BASED_OUTPATIENT_CLINIC_OR_DEPARTMENT_OTHER): Payer: Self-pay | Admitting: Emergency Medicine

## 2013-11-01 DIAGNOSIS — Z3202 Encounter for pregnancy test, result negative: Secondary | ICD-10-CM | POA: Insufficient documentation

## 2013-11-01 DIAGNOSIS — S86919A Strain of unspecified muscle(s) and tendon(s) at lower leg level, unspecified leg, initial encounter: Secondary | ICD-10-CM

## 2013-11-01 DIAGNOSIS — F17200 Nicotine dependence, unspecified, uncomplicated: Secondary | ICD-10-CM | POA: Insufficient documentation

## 2013-11-01 DIAGNOSIS — Z792 Long term (current) use of antibiotics: Secondary | ICD-10-CM | POA: Insufficient documentation

## 2013-11-01 DIAGNOSIS — Y929 Unspecified place or not applicable: Secondary | ICD-10-CM | POA: Insufficient documentation

## 2013-11-01 DIAGNOSIS — Y939 Activity, unspecified: Secondary | ICD-10-CM | POA: Insufficient documentation

## 2013-11-01 DIAGNOSIS — J45909 Unspecified asthma, uncomplicated: Secondary | ICD-10-CM | POA: Insufficient documentation

## 2013-11-01 DIAGNOSIS — X503XXA Overexertion from repetitive movements, initial encounter: Secondary | ICD-10-CM | POA: Insufficient documentation

## 2013-11-01 DIAGNOSIS — X500XXA Overexertion from strenuous movement or load, initial encounter: Secondary | ICD-10-CM | POA: Insufficient documentation

## 2013-11-01 DIAGNOSIS — Z8659 Personal history of other mental and behavioral disorders: Secondary | ICD-10-CM | POA: Insufficient documentation

## 2013-11-01 DIAGNOSIS — Z8619 Personal history of other infectious and parasitic diseases: Secondary | ICD-10-CM | POA: Insufficient documentation

## 2013-11-01 DIAGNOSIS — IMO0002 Reserved for concepts with insufficient information to code with codable children: Secondary | ICD-10-CM | POA: Insufficient documentation

## 2013-11-01 DIAGNOSIS — Z8669 Personal history of other diseases of the nervous system and sense organs: Secondary | ICD-10-CM | POA: Insufficient documentation

## 2013-11-01 LAB — PREGNANCY, URINE: PREG TEST UR: NEGATIVE

## 2013-11-01 MED ORDER — NAPROXEN 250 MG PO TABS
500.0000 mg | ORAL_TABLET | Freq: Once | ORAL | Status: AC
Start: 2013-11-01 — End: 2013-11-01
  Administered 2013-11-01: 500 mg via ORAL
  Filled 2013-11-01: qty 2

## 2013-11-01 MED ORDER — TRAMADOL HCL 50 MG PO TABS: 50.0000 mg | ORAL_TABLET | Freq: Four times a day (QID) | ORAL | Status: AC | PRN

## 2013-11-01 MED ORDER — MELOXICAM 7.5 MG PO TABS: 7.5000 mg | ORAL_TABLET | Freq: Every day | ORAL | Status: AC

## 2013-11-01 NOTE — ED Provider Notes (Signed)
CSN: [id number]     Arrival date & time 11/01/13  0016 History  This chart was scribed for Casey Reznik Alfonso Patten, MD by Casey Price, ED scribe.  This patient was seen in room MH05/MH05 and the patient's care was started at 12:46 AM.   Chief Complaint  Patient presents with  . Knee Pain      Patient is a 28 y.o. female presenting with knee pain. The history is provided by the patient. No language interpreter was used.  Knee Pain Location:  Knee Time since incident:  4 days Injury: no   Pain details:    Quality:  Aching   Radiates to:  Does not radiate   Severity:  Moderate   Onset quality:  Gradual   Duration:  4 days   Timing:  Constant Chronicity:  New Dislocation: no   Foreign body present:  No foreign bodies Prior injury to area:  No Relieved by:  Nothing Worsened by:  Activity Associated symptoms: no swelling and no tingling   Risk factors: no concern for non-accidental trauma    HPI Comments: Casey Price is a 28 y.o. female who presents to the Emergency Department complaining of keen pain and swelling, onset four days ago. Patient denies causative injury of trauma. Patient denies history of similar pain. She has been lifting a lot at work and has been moving her things  Past Medical History  Diagnosis Date  . Anxiety   . Abnormal Pap smear   . Depression   . Bipolar disorder schizophrenia  . Bipolar 1 disorder     No meds   . Schizophrenia     no meds   . Asthma   . Headache(784.0)   . Trichomonas   . Epilepsy     "last seizure age 83"   Past Surgical History  Procedure Laterality Date  . Other surgical history      cyst removed from her" tailbone"  . Incision and drainage of wound      pyelonidal cyst   Family History  Problem Relation Age of Onset  . Hypertension Mother   . Hyperlipidemia Mother   . Anxiety disorder Mother   . Hypertension Father   . Diabetes Father   . COPD Father   . Diabetes Sister   . Anesthesia problems Other     History  Substance Use Topics  . Smoking status: Current Every Day Smoker -- 0.25 packs/day for 13 years    Types: Cigarettes  . Smokeless tobacco: Never Used  . Alcohol Use: No   OB History   Grav Para Term Preterm Abortions TAB SAB Ect Mult Living   2 1 1  1  1   1      Review of Systems  All other systems reviewed and are negative.      Allergies  Betadine; Contrast media; Darvocet; Iodine; and Shellfish allergy  Home Medications   Current Outpatient Rx  Name  Route  Sig  Dispense  Refill  . albuterol (PROVENTIL HFA;VENTOLIN HFA) 108 (90 BASE) MCG/ACT inhaler   Inhalation   Inhale 2 puffs into the lungs every 4 (four) hours as needed for wheezing or shortness of breath.   1 Inhaler   0   . amoxicillin (AMOXIL) 500 MG capsule   Oral   Take 1 capsule (500 mg total) by mouth 3 (three) times daily.   30 capsule   0   . azithromycin (ZITHROMAX) 250 MG tablet   Oral   Take  1 tablet (250 mg total) by mouth daily. Take first 2 tablets together, then 1 every day until finished.   6 tablet   0   . HYDROcodone-acetaminophen (NORCO/VICODIN) 5-325 MG per tablet   Oral   Take 2 tablets by mouth every 4 (four) hours as needed for pain.   16 tablet   0   . ibuprofen (ADVIL,MOTRIN) 800 MG tablet   Oral   Take 1 tablet (800 mg total) by mouth 3 (three) times daily.   21 tablet   0   . Phenyleph-Promethazine-Cod 5-6.25-10 MG/5ML SYRP   Oral   Take 5 mLs by mouth every 4 (four) hours as needed.   120 mL   0   . pseudoephedrine (SUDAFED) 120 MG 12 hr tablet   Oral   Take 1 tablet (120 mg total) by mouth once.   20 tablet   0    Triage Vitals: BP 138/63  Pulse 94  Temp(Src) 98.5 F (36.9 C) (Oral)  Resp 20  Ht 5\' 4"  (1.626 m)  Wt 270 lb (122.471 kg)  BMI 46.32 kg/m2  SpO2 97%  LMP 09/28/2013 Physical Exam  Constitutional: She appears well-developed and well-nourished.  HENT:  Head: Normocephalic and atraumatic.  Mouth/Throat: Oropharynx is clear and  moist.  Eyes: Conjunctivae are normal. Pupils are equal, round, and reactive to light.  Neck: Normal range of motion. Neck supple.  Cardiovascular: Normal rate, regular rhythm and intact distal pulses.   Pulmonary/Chest: Effort normal and breath sounds normal. She has no wheezes. She has no rales.  Abdominal: Soft. Bowel sounds are normal. There is no tenderness. There is no rebound and no guarding.  Musculoskeletal: Normal range of motion. She exhibits no edema and no tenderness.  Negative anterior and posterior drawer tests no laxity to varus or valgus stress  Neurological: She is alert. She has normal reflexes.  Skin: Skin is warm and dry.  Psychiatric: She has a normal mood and affect.    ED Course  Procedures (including critical care time) DIAGNOSTIC STUDIES: Oxygen Saturation is 97% on room air, adequate by my interpretation.    COORDINATION OF CARE: 12:46 AM- Pt advised of plan for treatment and pt agrees.    Labs Review Labs Reviewed - No data to display Imaging Review No results found.   EKG Interpretation None      MDM   Final diagnoses:  None    Ace wrap ice and elevation and NSAIDS follow up PRN with orthopedics  I personally performed the services described in this documentation, which was scribed in my presence. The recorded information has been reviewed and is accurate.     Starlynn Klinkner Alfonso Patten, MD 11/01/13 [phone]

## 2013-11-01 NOTE — Discharge Instructions (Signed)
Knee Sprain A knee sprain is a tear in one of the strong, fibrous tissues that connect the bones (ligaments) in your knee. The severity of the sprain depends on how much of the ligament is torn. The tear can be either partial or complete. CAUSES  Often, sprains are a result of a fall or injury. The force of the impact causes the fibers of your ligament to stretch too much. This excess tension causes the fibers of your ligament to tear. SIGNS AND SYMPTOMS  You may have some loss of motion in your knee. Other symptoms include:  Bruising.  Pain in the knee area.  Tenderness of the knee to the touch.  Swelling. DIAGNOSIS  To diagnose a knee sprain, your health care provider will physically examine your knee. Your health care provider may also suggest an X-Langenberg exam of your knee to make sure no bones are broken. TREATMENT  If your ligament is only partially torn, treatment usually involves keeping the knee in a fixed position (immobilization) or bracing your knee for activities that require movement for several weeks. To do this, your health care provider will apply a bandage, cast, or splint to keep your knee from moving and to support your knee during movement until it heals. For a partially torn ligament, the healing process usually takes 4 6 weeks. If your ligament is completely torn, depending on which ligament it is, you may need surgery to reconnect the ligament to the bone or reconstruct it. After surgery, a cast or splint may be applied and will need to stay on your knee for 4 6 weeks while your ligament heals. HOME CARE INSTRUCTIONS  Keep your injured knee elevated to decrease swelling.  To ease pain and swelling, apply ice to the injured area:  Put ice in a plastic bag.  Place a towel between your skin and the bag.  Leave the ice on for 20 minutes, 2 3 times a day.  Only take medicine for pain as directed by your health care provider.  Do not leave your knee unprotected until  pain and stiffness go away (usually 4 6 weeks).  If you have a cast or splint, do not allow it to get wet. If you have been instructed not to remove it, cover it with a plastic bag when you shower or bathe. Do not swim.  Your health care provider may suggest exercises for you to do during your recovery to prevent or limit permanent weakness and stiffness. SEEK IMMEDIATE MEDICAL CARE IF:  Your cast or splint becomes damaged.  Your pain becomes worse.  You have significant pain, swelling, or numbness below the cast or splint. MAKE SURE YOU:  Understand these instructions.  Will watch your condition.  Will get help right away if you are not doing well or get worse. Document Released: 08/06/2005 Document Revised: 05/27/2013 Document Reviewed: 03/18/2013 [facility] Patient Information 2014 [employer] Saenz.  Cryotherapy Cryotherapy means treatment with cold. Ice or gel packs can be used to reduce both pain and swelling. Ice is the most helpful within the first 24 to 48 hours after an injury or flareup from overusing a muscle or joint. Sprains, strains, spasms, burning pain, shooting pain, and aches can all be eased with ice. Ice can also be used when recovering from surgery. Ice is effective, has very few side effects, and is safe for most people to use. PRECAUTIONS  Ice is not a safe treatment option for people with:  Raynaud's phenomenon. This is a condition  affecting small blood vessels in the extremities. Exposure to cold may cause your problems to return. °· Cold hypersensitivity. There are many forms of cold hypersensitivity, including: °· Cold urticaria. Red, itchy hives appear on the skin when the tissues begin to warm after being iced. °· Cold erythema. This is a red, itchy rash caused by exposure to cold. °· Cold hemoglobinuria. Red blood cells break down when the tissues begin to warm after being iced. The hemoglobin that carry oxygen are passed into the urine because they cannot  combine with blood proteins fast enough. °· Numbness or altered sensitivity in the area being iced. °If you have any of the following conditions, do not use ice until you have discussed cryotherapy with your caregiver: °· Heart conditions, such as arrhythmia, angina, or chronic heart disease. °· High blood pressure. °· Healing wounds or open skin in the area being iced. °· Current infections. °· Rheumatoid arthritis. °· Poor circulation. °· Diabetes. °Ice slows the blood flow in the region it is applied. This is beneficial when trying to stop inflamed tissues from spreading irritating chemicals to surrounding tissues. However, if you expose your skin to cold temperatures for too long or without the proper protection, you can damage your skin or nerves. Watch for signs of skin damage due to cold. °HOME CARE INSTRUCTIONS °Follow these tips to use ice and cold packs safely. °· Place a dry or damp towel between the ice and skin. A damp towel will cool the skin more quickly, so you may need to shorten the time that the ice is used. °· For a more rapid response, add gentle compression to the ice. °· Ice for no more than 10 to 20 minutes at a time. The bonier the area you are icing, the less time it will take to get the benefits of ice. °· Check your skin after 5 minutes to make sure there are no signs of a poor response to cold or skin damage. °· Rest 20 minutes or more in between uses. °· Once your skin is numb, you can end your treatment. You can test numbness by very lightly touching your skin. The touch should be so light that you do not see the skin dimple from the pressure of your fingertip. When using ice, most people will feel these normal sensations in this order: cold, burning, aching, and numbness. °· Do not use ice on someone who cannot communicate their responses to pain, such as small children or people with dementia. °HOW TO MAKE AN ICE PACK °Ice packs are the most common way to use ice therapy. Other  methods include ice massage, ice baths, and cryo-sprays. Muscle creams that cause a cold, tingly feeling do not offer the same benefits that ice offers and should not be used as a substitute unless recommended by your caregiver. °To make an ice pack, do one of the following: °· Place crushed ice or a bag of frozen vegetables in a sealable plastic bag. Squeeze out the excess air. Place this bag inside another plastic bag. Slide the bag into a pillowcase or place a damp towel between your skin and the bag. °· Mix 3 parts water with 1 part rubbing alcohol. Freeze the mixture in a sealable plastic bag. When you remove the mixture from the freezer, it will be slushy. Squeeze out the excess air. Place this bag inside another plastic bag. Slide the bag into a pillowcase or place a damp towel between your skin and the bag. °SEEK MEDICAL   CARE IF:  You develop white spots on your skin. This may give the skin a blotchy (mottled) appearance.  Your skin turns blue or pale.  Your skin becomes waxy or hard.  Your swelling gets worse. MAKE SURE YOU:   Understand these instructions.  Will watch your condition.  Will get help right away if you are not doing well or get worse. Document Released: 04/02/2011 Document Revised: 10/29/2011 Document Reviewed: 04/02/2011 [facility] Patient Information 2014 [place], [place].

## 2013-11-01 NOTE — ED Notes (Signed)
Reports decreased ROM d/t pain, swelling x four days.  Denies known injury or causative factor for pain.  No prior history of knee problems.

## 2014-06-21 ENCOUNTER — Encounter (HOSPITAL_BASED_OUTPATIENT_CLINIC_OR_DEPARTMENT_OTHER): Payer: Self-pay | Admitting: Emergency Medicine

## 2014-11-29 IMAGING — CR DG KNEE 1-2V*R*
2 series · 2 of 2 positions shown · non-contrast
Comparison: None available for comparison at time of study
interpretation.

CLINICAL DATA: Right knee pain, no known injury.

EXAM:
RIGHT KNEE - 1-2 VIEW

[t knee ap right]
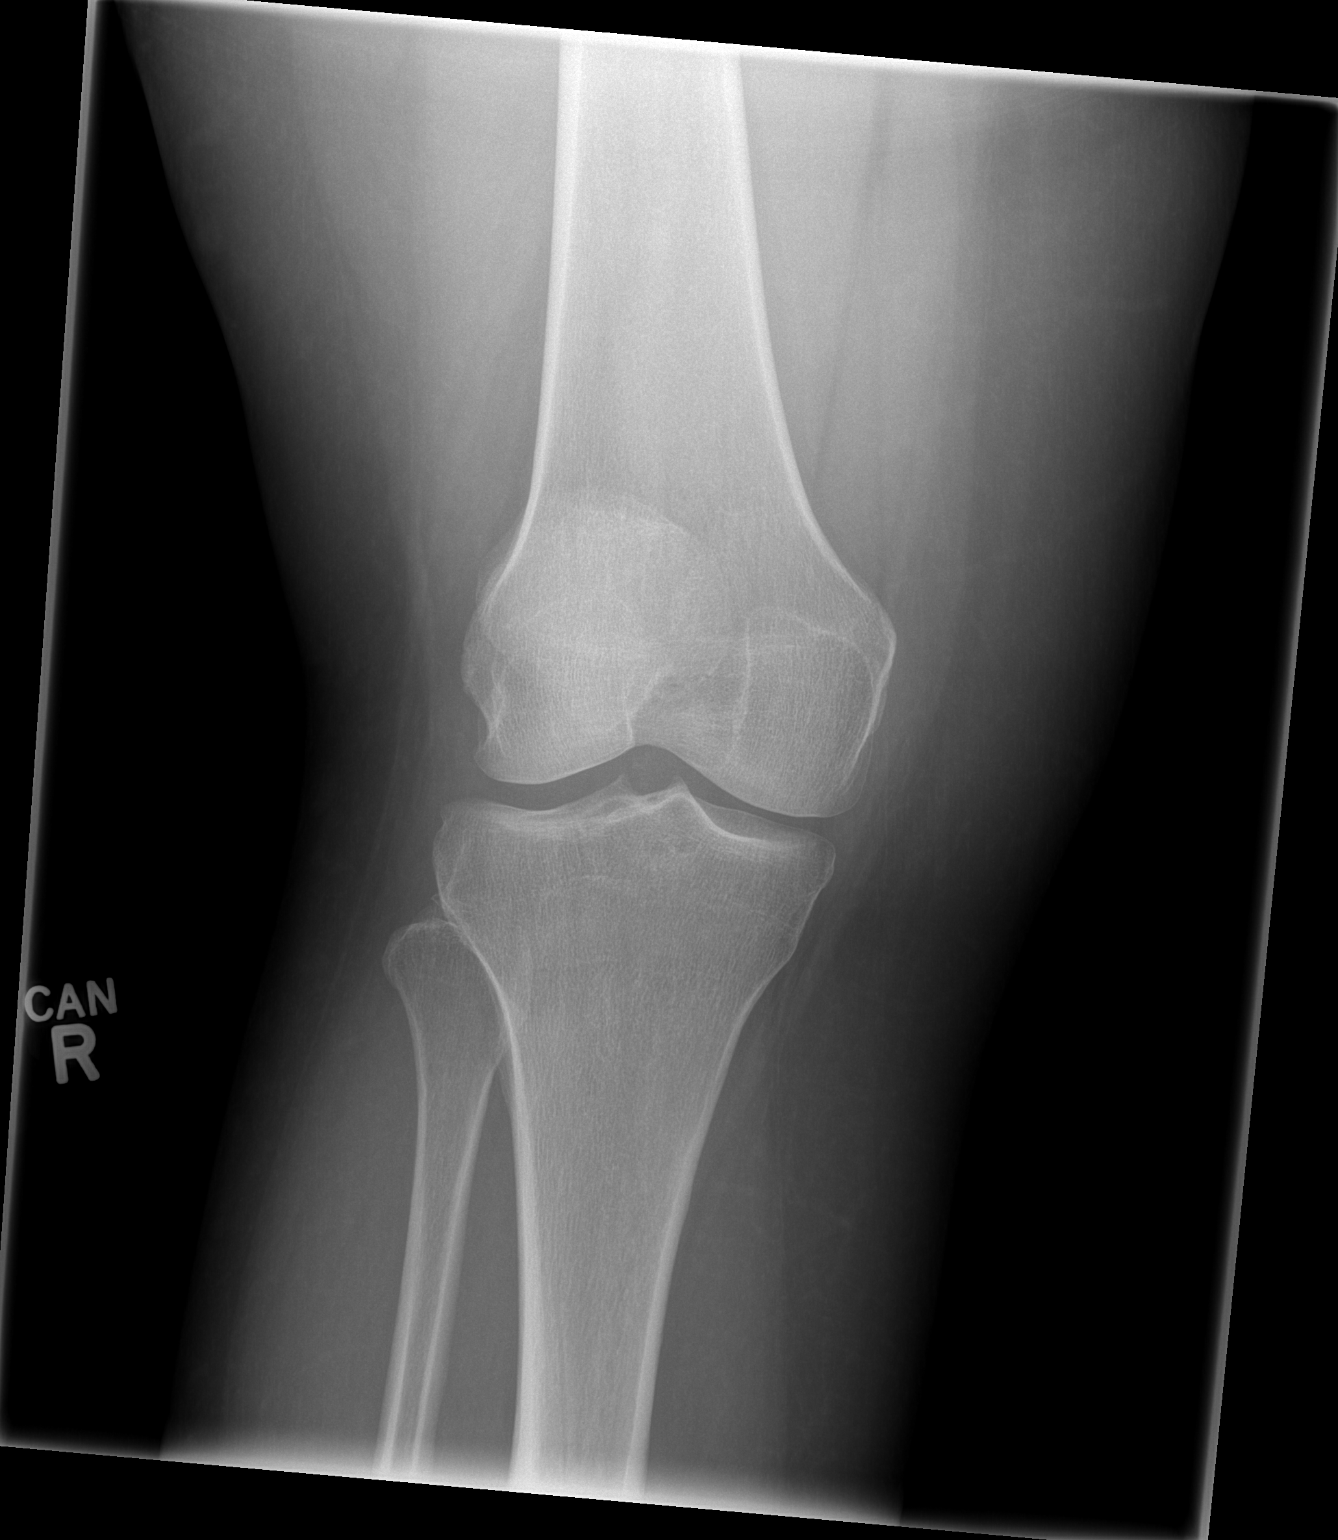

[t knee lat right]
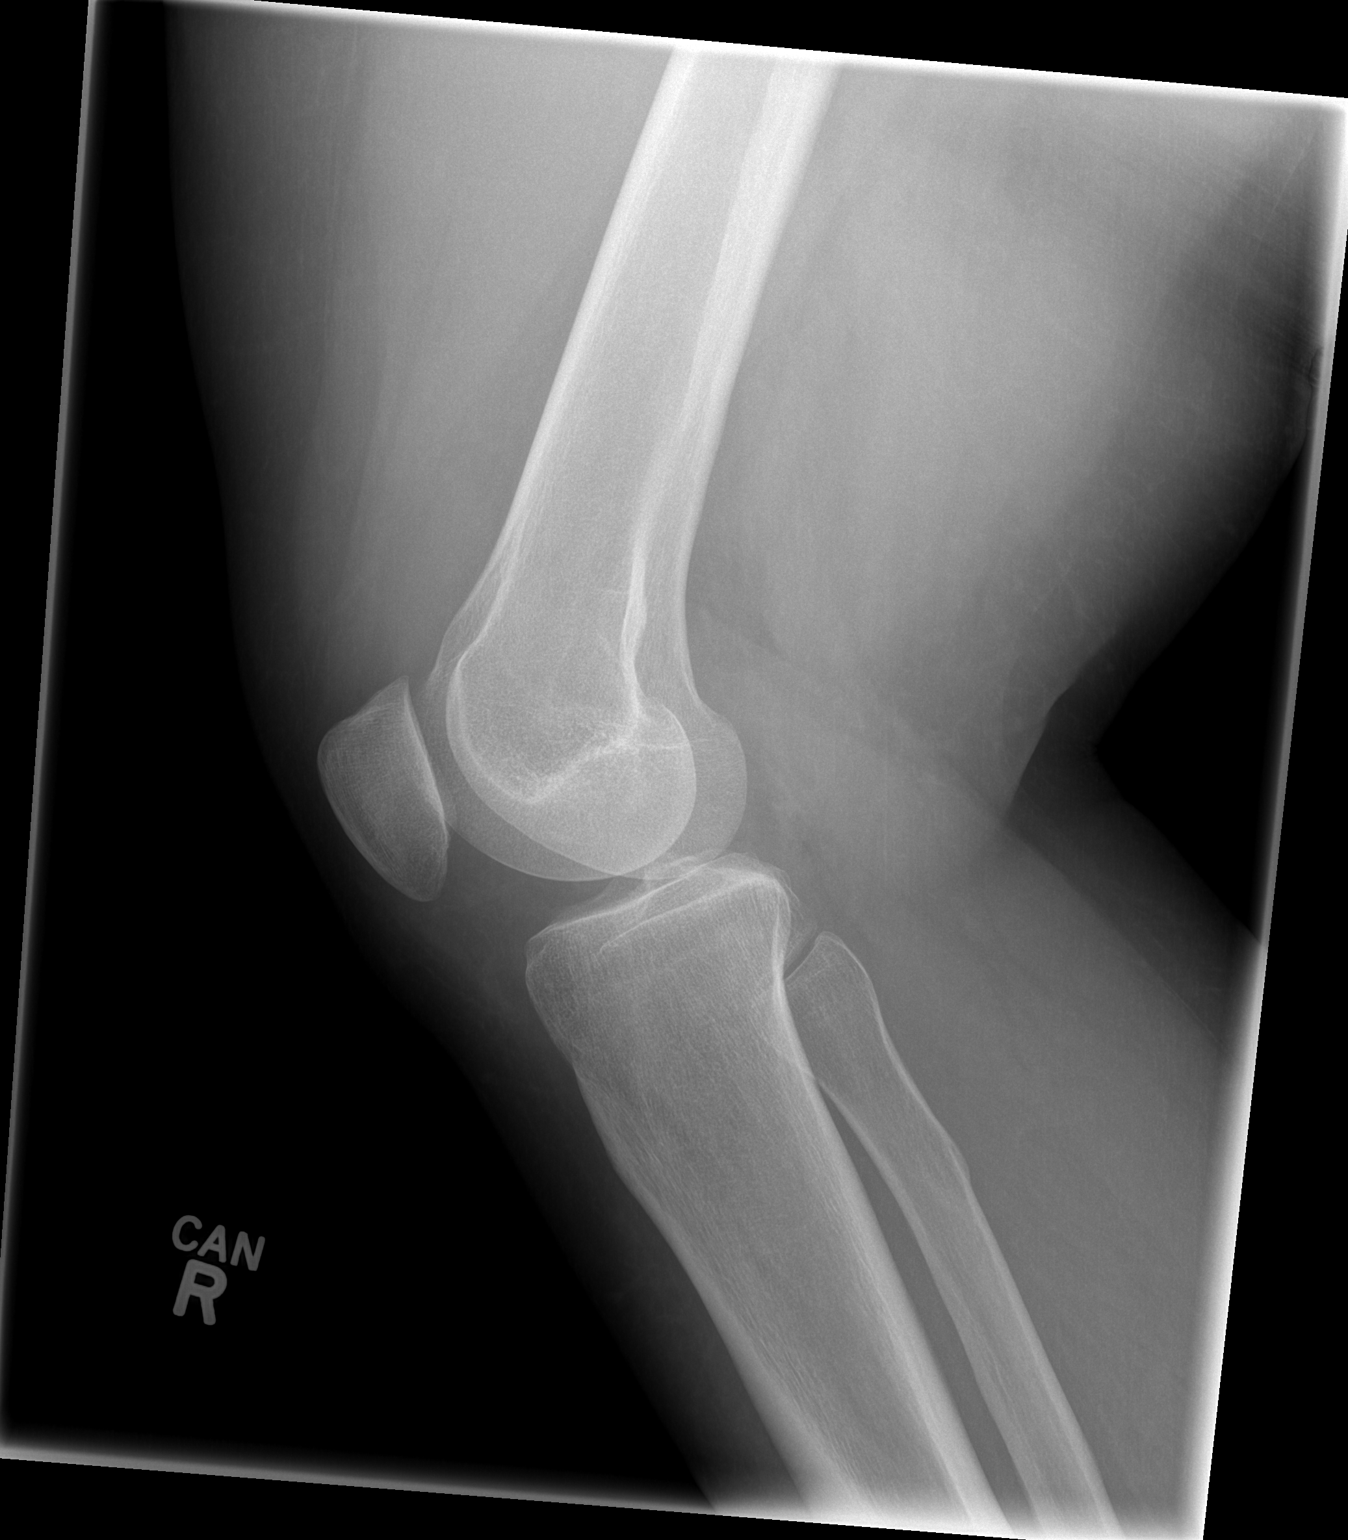

[2 of 2 positions shown; findings below may reference images not displayed]

FINDINGS: There is no evidence of fracture, dislocation, or joint effusion.
There is no evidence of arthropathy or other focal bone abnormality.
Soft tissues are unremarkable.
IMPRESSION: Negative.

  By: Jhemboy Padam
